# Patient Record
Sex: Male | Born: 1965 | Race: White | Hispanic: No | Marital: Married | Smoking: Never smoker
Health system: Southern US, Community
[De-identification: ages and names within clinical notes are randomized; demographics above are authoritative.]

## PROBLEM LIST (undated history)

## (undated) DIAGNOSIS — M1990 Unspecified osteoarthritis, unspecified site: Secondary | ICD-10-CM

## (undated) DIAGNOSIS — E785 Hyperlipidemia, unspecified: Secondary | ICD-10-CM

## (undated) DIAGNOSIS — Z87442 Personal history of urinary calculi: Secondary | ICD-10-CM

## (undated) DIAGNOSIS — E119 Type 2 diabetes mellitus without complications: Secondary | ICD-10-CM

## (undated) DIAGNOSIS — N200 Calculus of kidney: Secondary | ICD-10-CM

## (undated) DIAGNOSIS — G4730 Sleep apnea, unspecified: Secondary | ICD-10-CM

## (undated) DIAGNOSIS — J449 Chronic obstructive pulmonary disease, unspecified: Secondary | ICD-10-CM

## (undated) DIAGNOSIS — I10 Essential (primary) hypertension: Secondary | ICD-10-CM

## (undated) HISTORY — PX: APPENDECTOMY: SHX54

## (undated) HISTORY — PX: CHOLECYSTECTOMY: SHX55

---

## 2009-07-13 ENCOUNTER — Emergency Department (HOSPITAL_COMMUNITY): Admission: EM | Admit: 2009-07-13 | Discharge: 2009-07-13 | Payer: Self-pay | Admitting: Emergency Medicine

## 2010-07-12 ENCOUNTER — Emergency Department (HOSPITAL_COMMUNITY): Admission: EM | Admit: 2010-07-12 | Discharge: 2010-07-12 | Payer: Self-pay | Admitting: Emergency Medicine

## 2010-12-02 LAB — D-DIMER, QUANTITATIVE: D-Dimer, Quant: 0.27 ug/mL-FEU (ref 0.00–0.48)

## 2017-11-08 ENCOUNTER — Encounter: Payer: Self-pay | Admitting: Gastroenterology

## 2017-11-29 ENCOUNTER — Ambulatory Visit: Payer: Self-pay

## 2019-04-25 ENCOUNTER — Encounter: Payer: Self-pay | Admitting: *Deleted

## 2019-05-23 ENCOUNTER — Other Ambulatory Visit: Payer: Self-pay

## 2019-05-23 ENCOUNTER — Ambulatory Visit (INDEPENDENT_AMBULATORY_CARE_PROVIDER_SITE_OTHER): Payer: BC Managed Care – PPO | Admitting: *Deleted

## 2019-05-23 DIAGNOSIS — Z1211 Encounter for screening for malignant neoplasm of colon: Secondary | ICD-10-CM

## 2019-05-23 NOTE — Progress Notes (Signed)
Pt did not want to set up a procedure date today.  He will call me back on Wednesday when he has his work calendar available.

## 2019-05-23 NOTE — Progress Notes (Signed)
Ok to schedule with Dr. Oneida Alar with 12.5mg  pre-procedure Phenergan  When he is able to schedule will need to hold his Januvia the morning of (or the night before, depending on when he normally takes it). Metformin, hold the morning of.  On prep day: Check CBG ac and hs as well (if they normally check their blood sugar) as if the patient feels like their blood sugar is off. Can use soda, juice (that's in [place]) as needed for any low blood sugar.  Check CBG on arrival to endo unit.

## 2019-05-23 NOTE — Progress Notes (Addendum)
Gastroenterology Pre-Procedure Review  Request Date: 05/23/2019 Requesting Physician: Althea Grimmer, NP @ [facility], Last TCS 20 to 25 years ago, pt unsure of physician who did it, no polyps per pt  PATIENT REVIEW QUESTIONS: The patient responded to the following health history questions as indicated:    1. Diabetes Melitis: yes 2. Joint replacements in the past 12 months: no 3. Major health problems in the past 3 months: no 4. Has an artificial valve or MVP: no 5. Has a defibrillator: no 6. Has been advised in past to take antibiotics in advance of a procedure like teeth cleaning: no 7. Family history of colon cancer: no  8. Alcohol Use: yes, 2 to 3 beers a week and 1 shot of liquor  9. Illicit drug Use: no 10. History of sleep apnea: no  11. History of coronary artery or other vascular stents placed within the last 12 months: no 12. History of any prior anesthesia complications: no 13. There is no height or weight on file to calculate BMI. ht: 5 '10 wt: 265 lbs    MEDICATIONS & ALLERGIES:    Patient reports the following regarding taking any blood thinners:   Plavix? no Aspirin? no Coumadin? no Brilinta? no Xarelto? no Eliquis? no Pradaxa? no Savaysa? no Effient? no  Patient confirms/reports the following medications:  Current Outpatient Medications  Medication Sig Dispense Refill  . lisinopril (ZESTRIL) 20 MG tablet Take 20 mg by mouth daily.    . meloxicam (MOBIC) 15 MG tablet Take 15 mg by mouth daily.    . metFORMIN (GLUCOPHAGE) 1000 MG tablet Take 1,000 mg by mouth daily.    . sitaGLIPtin (JANUVIA) 100 MG tablet Take 100 mg by mouth daily.     No current facility-administered medications for this visit.     Patient confirms/reports the following allergies:  No Known Allergies  No orders of the defined types were placed in this encounter.   AUTHORIZATION INFORMATION Primary Insurance: [place],  [place] #: [id number],  Group #:  [id number] Pre-Cert / Josem Kaufmann required: No, not required per Doreene Adas Pre-Cert / Auth #: Ref# [id number]  SCHEDULE INFORMATION: Procedure has been scheduled as follows:  Date: 09/10/2019, Time: 8:30 Location: [facility] with Dr. Oneida Alar  This Gastroenterology Pre-Precedure Review Form is being routed to the following provider(s): Walden Field, NP

## 2019-05-30 ENCOUNTER — Telehealth: Payer: Self-pay | Admitting: *Deleted

## 2019-05-30 NOTE — Telephone Encounter (Signed)
Tried to call pt to follow up and get him scheduled for a procedure.

## 2019-05-31 NOTE — Telephone Encounter (Signed)
Lmom to follow up on pt.  Need to schedule him for procedure.

## 2019-06-06 ENCOUNTER — Encounter: Payer: Self-pay | Admitting: *Deleted

## 2019-06-06 MED ORDER — PEG 3350-KCL-NA BICARB-NACL 420 G PO SOLR: 4000.0000 mL | Freq: Once | ORAL | 0 refills | Status: AC

## 2019-06-06 NOTE — Progress Notes (Signed)
Mailed letter to pt with diabetes medication adjustments.   

## 2019-06-06 NOTE — Addendum Note (Signed)
Addended by: Metro Kung on: 06/06/2019 01:57 PM   Modules accepted: Orders

## 2019-06-06 NOTE — Progress Notes (Signed)
Called pt and he scheduled his procedure for 09/10/2019.

## 2019-06-06 NOTE — Patient Instructions (Addendum)
Andrew Roach   [birth year] MRN: [id number]    Procedure Date: 09/10/2019 Time to register: 7:30 am Place to register: [facility] Procedure Time: 8:30 am Scheduled provider: Dr. Oneida Alar  PREPARATION FOR COLONOSCOPY WITH TRI-LYTE SPLIT PREP  Please notify us immediately if you are diabetic, take iron supplements, or if you are on Coumadin or any other blood thinners.   Please hold the following medications: See letter  You will need to purchase 1 fleet enema and 1 box of Bisacodyl 71m tablets.   1 DAY BEFORE PROCEDURE:  DATE: 09/09/2019  DAY: Sunday Continue clear liquids the entire day - NO SOLID FOOD.   Diabetic medications adjustments for today: See letter  At 2:00 pm:  Take 2 Bisacodyl tablets.   At 4:00pm:  Start drinking your solution. Make sure you mix well per instructions on the bottle. Try to drink 1 (one) 8 ounce glass every 10-15 minutes until you have consumed HALF the jug. You should complete by 6:00pm.You must keep the left over solution refrigerated until completed next day.  Continue clear liquids. You must drink plenty of clear liquids to prevent dehyration and kidney failure.     DAY OF PROCEDURE:   DATE: 09/10/2019   DAY: Monday If you take medications for your heart, blood pressure or breathing, you may take these medications.  Diabetic medications adjustments for today: See letter  Five hours before your procedure time @ 3:30 am:  Finish remaining amout of bowel prep, drinking 1 (one) 8 ounce glass every 10-15 minutes until complete. You have two hours to consume remaining prep.   Three hours before your procedure time @ 5:30 am:  Nothing by mouth.   At least one hour before going to the hospital:  Give yourself one Fleet enema. You may take your morning medications with sip of water unless we have instructed otherwise.      Please see below for Dietary Information.  CLEAR LIQUIDS INCLUDE:  Water Jello (NOT red in color)   Ice Popsicles  (NOT red in color)   Tea (sugar ok, no milk/cream) Powdered fruit flavored drinks  Coffee (sugar ok, no milk/cream) Gatorade/ Lemonade/ Kool-Aid  (NOT red in color)   Juice: apple, white grape, white cranberry Soft drinks  Clear bullion, consomme, broth (fat free beef/chicken/vegetable)  Carbonated beverages (any kind)  Strained chicken noodle soup Hard Candy   Remember: Clear liquids are liquids that will allow you to see your fingers on the other side of a clear glass. Be sure liquids are NOT red in color, and not cloudy, but CLEAR.  DO NOT EAT OR DRINK ANY OF THE FOLLOWING:  Dairy products of any kind   Cranberry juice Tomato juice / V8 juice   Grapefruit juice Orange juice     Red grape juice  Do not eat any solid foods, including such foods as: cereal, oatmeal, yogurt, fruits, vegetables, creamed soups, eggs, bread, crackers, pureed foods in a blender, etc.   HELPFUL HINTS FOR DRINKING PREP SOLUTION:   Make sure prep is extremely cold. Mix and refrigerate the the morning of the prep. You may also put in the freezer.   You may try mixing some Crystal Light or Country Time Lemonade if you prefer. Mix in small amounts; add more if necessary.  Try drinking through a straw  Rinse mouth with water or a mouthwash between glasses, to remove after-taste.  Try sipping on a cold beverage /ice/ popsicles between glasses of prep.  Place a  piece of sugar-free hard candy in mouth between glasses.  If you become nauseated, try consuming smaller amounts, or stretch out the time between glasses. Stop for 30-60 minutes, then slowly start back drinking.        OTHER INSTRUCTIONS  You will need a responsible adult at least 55 years of age to accompany you and drive you home. This person must remain in the waiting room during your procedure. The hospital will cancel your procedure if you do not have a responsible adult with you.   1. Wear loose fitting clothing that is easily  removed. 2. Leave jewelry and other valuables at home.  3. Remove all body piercing jewelry and leave at home. 4. Total time from sign-in until discharge is approximately 2-3 hours. 5. You should go home directly after your procedure and rest. You can resume normal activities the day after your procedure. 6. The day of your procedure you should not:  Drive  Make legal decisions  Operate machinery  Drink alcohol  Return to work   You may call the office (Dept: [phone]) before 5:00pm, or page the doctor on call [phone]) after 5:00pm, for further instructions, if necessary.   Insurance Information YOU WILL NEED TO CHECK WITH YOUR INSURANCE COMPANY FOR THE BENEFITS OF COVERAGE YOU HAVE FOR THIS PROCEDURE.  UNFORTUNATELY, NOT ALL INSURANCE COMPANIES HAVE BENEFITS TO COVER ALL OR PART OF THESE TYPES OF PROCEDURES.  IT IS YOUR RESPONSIBILITY TO CHECK YOUR BENEFITS, HOWEVER, WE WILL BE GLAD TO ASSIST YOU WITH ANY CODES YOUR INSURANCE COMPANY MAY NEED.    PLEASE NOTE THAT MOST INSURANCE COMPANIES WILL NOT COVER A SCREENING COLONOSCOPY FOR PEOPLE UNDER THE AGE OF 50  IF YOU HAVE BCBS INSURANCE, YOU MAY HAVE BENEFITS FOR A SCREENING COLONOSCOPY BUT IF POLYPS ARE FOUND THE DIAGNOSIS WILL CHANGE AND THEN YOU MAY HAVE A DEDUCTIBLE THAT WILL NEED TO BE MET. SO PLEASE MAKE SURE YOU CHECK YOUR BENEFITS FOR A SCREENING COLONOSCOPY AS WELL AS A DIAGNOSTIC COLONOSCOPY.

## 2019-06-06 NOTE — Addendum Note (Signed)
Addended by: Metro Kung on: 06/06/2019 04:53 PM   Modules accepted: Orders, SmartSet

## 2019-09-04 ENCOUNTER — Telehealth: Payer: Self-pay | Admitting: Gastroenterology

## 2019-09-04 NOTE — Telephone Encounter (Signed)
Pt needs to cancel his procedure with [facility] on 09/10/2019. He will call later to reschedule

## 2019-09-04 NOTE — Telephone Encounter (Signed)
Called pt back.  Wife answered and informed me that they needed to cx the procedure for her husband.  She said that their daughter injured her ACL.  She said that they wanted to just cancel the procedure for now.  She said that he would call us back when he is ready to reschedule. Called Endo and informed Eber Jones.

## 2019-09-07 ENCOUNTER — Other Ambulatory Visit (HOSPITAL_COMMUNITY): Payer: BC Managed Care – PPO

## 2019-09-10 ENCOUNTER — Encounter (HOSPITAL_COMMUNITY): Payer: Self-pay

## 2019-09-10 ENCOUNTER — Ambulatory Visit (HOSPITAL_COMMUNITY): Admit: 2019-09-10 | Payer: BC Managed Care – PPO | Admitting: Gastroenterology

## 2019-09-10 SURGERY — COLONOSCOPY
Anesthesia: Moderate Sedation

## 2020-12-10 DIAGNOSIS — M2550 Pain in unspecified joint: Secondary | ICD-10-CM | POA: Insufficient documentation

## 2021-07-02 ENCOUNTER — Emergency Department (HOSPITAL_COMMUNITY)
Admission: EM | Admit: 2021-07-02 | Discharge: 2021-07-02 | Disposition: A | Discharge status: Home / Self Care | Payer: BC Managed Care – PPO | Attending: Emergency Medicine | Admitting: Emergency Medicine

## 2021-07-02 ENCOUNTER — Encounter (HOSPITAL_COMMUNITY): Payer: Self-pay | Admitting: *Deleted

## 2021-07-02 ENCOUNTER — Emergency Department (HOSPITAL_COMMUNITY): Payer: BC Managed Care – PPO

## 2021-07-02 DIAGNOSIS — D72829 Elevated white blood cell count, unspecified: Secondary | ICD-10-CM | POA: Insufficient documentation

## 2021-07-02 DIAGNOSIS — N201 Calculus of ureter: Secondary | ICD-10-CM | POA: Diagnosis not present

## 2021-07-02 DIAGNOSIS — R1031 Right lower quadrant pain: Secondary | ICD-10-CM | POA: Diagnosis present

## 2021-07-02 HISTORY — DX: Calculus of kidney: N20.0

## 2021-07-02 LAB — BASIC METABOLIC PANEL
Anion gap: 6 (ref 5–15)
BUN: 26 mg/dL — ABNORMAL HIGH (ref 6–20)
CO2: 28 mmol/L (ref 22–32)
Calcium: 8.9 mg/dL (ref 8.9–10.3)
Chloride: 107 mmol/L (ref 98–111)
Creatinine, Ser: 1.23 mg/dL (ref 0.61–1.24)
GFR, Estimated: >60 mL/min (ref >60)
Glucose, Bld: 160 mg/dL — ABNORMAL HIGH (ref 70–99)
Potassium: 4.2 mmol/L (ref 3.5–5.1)
Sodium: 141 mmol/L (ref 135–145)

## 2021-07-02 LAB — CBC WITH DIFFERENTIAL/PLATELET
Abs Immature Granulocytes: 0.07 10*3/uL (ref 0.00–0.07)
Basophils Absolute: 0.1 10*3/uL (ref 0.0–0.1)
Basophils Relative: 0 %
Eosinophils Absolute: 0.1 10*3/uL (ref 0.0–0.5)
Eosinophils Relative: 1 %
HCT: 44.5 % (ref 39.0–52.0)
Hemoglobin: 15.1 g/dL (ref 13.0–17.0)
Immature Granulocytes: 1 %
Lymphocytes Relative: 11 %
Lymphs Abs: 1.4 10*3/uL (ref 0.7–4.0)
MCH: 32.5 pg (ref 26.0–34.0)
MCHC: 33.9 g/dL (ref 30.0–36.0)
MCV: 95.7 fL (ref 80.0–100.0)
Monocytes Absolute: 0.8 10*3/uL (ref 0.1–1.0)
Monocytes Relative: 7 %
Neutro Abs: 9.6 10*3/uL — ABNORMAL HIGH (ref 1.7–7.7)
Neutrophils Relative %: 80 %
Platelets: 239 10*3/uL (ref 150–400)
RBC: 4.65 MIL/uL (ref 4.22–5.81)
RDW: 11.9 % (ref 11.5–15.5)
WBC: 11.9 10*3/uL — ABNORMAL HIGH (ref 4.0–10.5)
nRBC: 0 % (ref 0.0–0.2)

## 2021-07-02 LAB — URINALYSIS, ROUTINE W REFLEX MICROSCOPIC
Bacteria, UA: NONE SEEN
Bilirubin Urine: NEGATIVE
Glucose, UA: NEGATIVE mg/dL
Ketones, ur: 5 mg/dL — AB
Leukocytes,Ua: NEGATIVE
Nitrite: NEGATIVE
Protein, ur: NEGATIVE mg/dL
RBC / HPF: >50 RBC/hpf — ABNORMAL HIGH (ref 0–5)
Specific Gravity, Urine: 1.017 (ref 1.005–1.030)
pH: 7 (ref 5.0–8.0)

## 2021-07-02 MED ORDER — OXYCODONE-ACETAMINOPHEN 5-325 MG PO TABS: 1.0000 | ORAL_TABLET | Freq: Three times a day (TID) | ORAL | 0 refills | Status: AC | PRN

## 2021-07-02 MED ORDER — SODIUM CHLORIDE 0.9 % IV BOLUS
1000.0000 mL | Freq: Once | INTRAVENOUS | Status: AC
  Administered 2021-07-02: 1000 mL via INTRAVENOUS

## 2021-07-02 MED ORDER — TAMSULOSIN HCL 0.4 MG PO CAPS: 0.4000 mg | ORAL_CAPSULE | Freq: Every day | ORAL | 0 refills | Status: AC

## 2021-07-02 MED ORDER — ONDANSETRON HCL 4 MG/2ML IJ SOLN
4.0000 mg | Freq: Once | INTRAMUSCULAR | Status: AC
  Administered 2021-07-02: 4 mg via INTRAVENOUS
  Filled 2021-07-02: qty 2

## 2021-07-02 MED ORDER — KETOROLAC TROMETHAMINE 15 MG/ML IJ SOLN
15.0000 mg | Freq: Once | INTRAMUSCULAR | Status: AC
  Administered 2021-07-02: 15 mg via INTRAVENOUS
  Filled 2021-07-02: qty 1

## 2021-07-02 MED ORDER — ONDANSETRON HCL 4 MG PO TABS: 4.0000 mg | ORAL_TABLET | Freq: Three times a day (TID) | ORAL | 0 refills | Status: DC | PRN

## 2021-07-02 MED ORDER — HYDROMORPHONE HCL 1 MG/ML IJ SOLN
1.0000 mg | Freq: Once | INTRAMUSCULAR | Status: AC
  Administered 2021-07-02: 1 mg via INTRAVENOUS
  Filled 2021-07-02: qty 1

## 2021-07-02 MED ORDER — HYDROMORPHONE HCL 1 MG/ML IJ SOLN
0.5000 mg | Freq: Once | INTRAMUSCULAR | Status: AC
  Administered 2021-07-02: 0.5 mg via INTRAVENOUS
  Filled 2021-07-02: qty 1

## 2021-07-02 NOTE — Discharge Instructions (Signed)
You have a 2 mm stone in your right ureter about to pass in your bladder.  I have started you on Flomax this will help dilate your ureter please wear this medication can make you drop your blood pressure become lightheaded dizziness with this medication please discontinue.  Have also given you Zofran for nausea please take as prescribed. I have given you a short course of narcotics please take as prescribed.  This medication can make you drowsy do not consume alcohol or operate heavy machinery when taking this medication.  This medication is Tylenol in it do not take Tylenol and take this medication.   Please follow-up with urology for further evaluation.  Come back to the emergency department if you develop chest pain, shortness of breath, severe abdominal pain, uncontrolled nausea, vomiting, diarrhea.

## 2021-07-02 NOTE — ED Triage Notes (Signed)
Right flank pain onset an hour ago

## 2021-07-02 NOTE — ED Provider Notes (Signed)
[facility] Provider Note   CSN: [id number] Arrival date & time: 07/02/21  1600     History Chief Complaint  Patient presents with   Flank Pain    Andrew Roach is a 55 y.o. male.  HPI  Patient with no significant medical history presents to the emergency department with chief complaint of severe right lower quadrant pain and flank pain.  Patient stated came on suddenly, started around 3 PM today, describes it as a sharp stabbing-like sensation, pain is worsened with urination, he notes that he is having difficulty urinating, he had nausea and vomiting, but denies constipation diarrhea, denies hematemesis or coffee-ground emesis, denies fevers, chills, chest pain, shortness of breath, general body aches, he has no other complaints this time.  He has no significant abdominal history, states he has had kidney stones in the past and this feels very similar to this.  Denies alleviating or aggravating factors.  Past Medical History:  Diagnosis Date   Kidney stones     There are no problems to display for this patient.        No family history on file.  Social History   Tobacco Use   Smoking status: Never   Smokeless tobacco: Never  Substance Use Topics   Alcohol use: Never   Drug use: Never    Home Medications Prior to Admission medications   Medication Sig Start Date End Date Taking? Authorizing Provider  ondansetron (ZOFRAN) 4 MG tablet Take 1 tablet (4 mg total) by mouth every 8 (eight) hours as needed for nausea or vomiting. 07/02/21  Yes Marcello Fennel, PA-C  oxyCODONE-acetaminophen (PERCOCET/ROXICET) 5-325 MG tablet Take 1 tablet by mouth every 8 (eight) hours as needed for up to 2 days for severe pain. 07/02/21 07/04/21 Yes Marcello Fennel, PA-C  oxyCODONE-acetaminophen (PERCOCET/ROXICET) 5-325 MG tablet Take 1 tablet by mouth every 8 (eight) hours as needed for up to 2 days for severe pain. 07/02/21 07/04/21 Yes Marcello Fennel,  PA-C  tamsulosin (FLOMAX) 0.4 MG CAPS capsule Take 1 capsule (0.4 mg total) by mouth daily for 7 days. 07/02/21 07/09/21 Yes Marcello Fennel, PA-C  lisinopril (ZESTRIL) 20 MG tablet Take 20 mg by mouth daily.    [provider]  meloxicam (MOBIC) 15 MG tablet Take 15 mg by mouth daily.    [provider]  metFORMIN (GLUCOPHAGE) 1000 MG tablet Take 1,000 mg by mouth daily.    [provider]  sitaGLIPtin (JANUVIA) 100 MG tablet Take 100 mg by mouth daily.    [provider]    Allergies    Patient has no known allergies.  Review of Systems   Review of Systems  Constitutional:  Negative for chills and fever.  HENT:  Negative for congestion.   Respiratory:  Negative for shortness of breath.   Cardiovascular:  Negative for chest pain.  Gastrointestinal:  Positive for abdominal pain, nausea and vomiting. Negative for diarrhea.  Genitourinary:  Positive for dysuria and flank pain. Negative for enuresis.  Musculoskeletal:  Negative for back pain.  Skin:  Negative for rash.  Neurological:  Negative for dizziness.  Hematological:  Does not bruise/bleed easily.   Physical Exam Updated Vital Signs BP 106/66 (BP Location: Left Arm)   Pulse 62   Temp (!) 97.5 F (36.4 C) (Oral)   Resp 17   SpO2 98%   Physical Exam Vitals and nursing note reviewed.  Constitutional:      General: He is in  acute distress.     Appearance: He is not ill-appearing.     Comments: On exam patient was in acute distress, standing walk around pacing due to pain.  HENT:     Head: Normocephalic and atraumatic.     Nose: No congestion.  Eyes:     Conjunctiva/sclera: Conjunctivae normal.  Cardiovascular:     Rate and Rhythm: Normal rate and regular rhythm.  Pulmonary:     Effort: Pulmonary effort is normal.  Abdominal:     General: There is no distension.     Palpations: Abdomen is soft.     Tenderness: There is abdominal tenderness. There is right CVA tenderness. There  is no left CVA tenderness.     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  Skin:    General: Skin is warm and dry.  Neurological:     Mental Status: He is alert.  Psychiatric:        Mood and Affect: Mood normal.    ED Results / Procedures / Treatments   Labs (all labs ordered are listed, but only abnormal results are displayed) Labs Reviewed  BASIC METABOLIC PANEL - Abnormal; Notable for the following components:      Result Value   Glucose, Bld 160 (*)    BUN 26 (*)    All other components within normal limits  CBC WITH DIFFERENTIAL/PLATELET - Abnormal; Notable for the following components:   WBC 11.9 (*)    Neutro Abs 9.6 (*)    All other components within normal limits  URINALYSIS, ROUTINE W REFLEX MICROSCOPIC - Abnormal; Notable for the following components:   Hgb urine dipstick MODERATE (*)    Ketones, ur 5 (*)    RBC / HPF >50 (*)    All other components within normal limits    EKG None  Radiology CT Renal Stone Study  Result Date: 07/02/2021 CLINICAL DATA:  Right flank pain EXAM: CT ABDOMEN AND PELVIS WITHOUT CONTRAST TECHNIQUE: Multidetector CT imaging of the abdomen and pelvis was performed following the standard protocol without IV contrast. COMPARISON:  None. FINDINGS: Lower chest: No acute abnormality. Evaluation of the abdominal viscera limited by lack of IV contrast. Hepatobiliary: Hepatic steatosis. No focal liver lesion identified. There is a small gallstone. No gallbladder wall thickening or pericholecystic fluid. Pancreas: Unremarkable. No surrounding inflammatory changes. Spleen: Normal in size without focal abnormality. Adrenals/Urinary Tract: Adrenal glands are unremarkable. There is mild right hydronephrosis secondary to a 75mm calculus in the distal right ureter (series 2,  image 72). There is a punctate nonobstructing calculus in the left kidney. No hydronephrosis in the left kidney. No mass lesions in the kidneys. Unremarkable urinary bladder. Stomach/Bowel: Stomach is within normal limits. Appendix not well visualized but there are no inflammatory changes in the right lower quadrant to suggest appendicitis. No evidence of bowel wall thickening, distention, or inflammatory changes. Vascular/Lymphatic: Mild aortic atherosclerosis. No enlarged abdominal or pelvic lymph nodes. Reproductive: Enlarged prostate measuring 6.4 cm in diameter. Other: No abdominopelvic ascites. Musculoskeletal: Degenerate disc disease in the lumbar spine. There is hyperdensity in the L1-2 disc space possibly related to prior intervention. IMPRESSION: 1. Mild right hydronephrosis secondary to a 2 mm calculus in the distal right ureter. 2. Hepatic steatosis. 3. Cholelithiasis without evidence of acute cholecystitis. 4. Enlarged prostate measuring 6.4 cm in diameter. 5. Aortic atherosclerosis. Aortic Atherosclerosis (  ICD10-I70.0). Electronically Signed   By: Audie Pinto M.D.   On: 07/02/2021 18:11    Procedures Procedures   Medications Ordered in ED Medications  HYDROmorphone (DILAUDID) injection 0.5 mg (has no administration in time range)  ketorolac (TORADOL) 15 MG/ML injection 15 mg (has no administration in time range)  sodium chloride 0.9 % bolus 1,000 mL (0 mLs Intravenous Stopped 07/02/21 1812)  ondansetron (ZOFRAN) injection 4 mg (4 mg Intravenous Given 07/02/21 1628)  HYDROmorphone (DILAUDID) injection 1 mg (1 mg Intravenous Given 07/02/21 1628)    ED Course  I have reviewed the triage vital signs and the nursing notes.  Pertinent labs & imaging results that were available during my care of the patient were reviewed by me and considered in my medical decision making (see chart for details).    MDM Rules/Calculators/A&P                          Initial impression-presents with  right-sided flank pain he is alert, appears in acute distress, vital signs reassuring.  Suspect kidney stone, will obtain basic lab work-up, provide patient with pain medications, obtain UA and reassess.  Work-up-CBC shows slight leukocytosis of 11.9, BMP shows glucose of 160, BUN 26.CT renal reveals mild right versus secondary to a 2 mm calculus in the distal right ureter.  Cholelithiasis without acute evidence of cholecystitis.  Enlarged prostate measuring 6.4 cm in diameter.  UA negative for nitrates, leukocytes, hematuria.  Positive for red blood cells.  Reassessment-patient reassessed after fluids, Dilaudid, antiemetics he states he is feeling much better, he is resting calmly in bed, will continue to monitor.  We will send him down for CT renal for further eval.   Patient was updated on imaging, states he is feeling better, vital signs remained stable, will obtain UA to rule out septic stone.  Patient is reassessed has no complaints this time, vital signs remained stable, he is agreeable for discharge.  Rule out-I have low suspicion for systemic infection as patient is nontoxic-appearing, vital signs are reassuring.  Does have slight leukocytosis of 11.9 but likely this is more acute phase reaction is due to the 2 mm stone.  I have low suspicion for a septic stone as urine is not submitted for signs of infection.  I have low suspicion for [place] as CT imaging is negative for these findings.  Low suspicion for bowel obstruction as abdomen is nondistended normal bowel sounds, dull to percussion, passing gas and having normal bowel movements.  Low suspicion for dissection of the aorta as presentation to atypical etiology.  Plan-  2 millimeter distal stone-likely causing his pain,  will start patient on Flomax, pain medication, antiemetics, have him follow-up with urology for further evaluation.  Given strict return precautions.  Vital signs have remained stable, no indication for hospital admission.     Patient given at home care as well strict return precautions.  Patient verbalized that they understood agreed to said plan.  Final Clinical Impression(s) / ED Diagnoses Final diagnoses:  Ureterolithiasis    Rx / DC Orders ED Discharge Orders          Ordered    tamsulosin (FLOMAX) 0.4 MG CAPS capsule  Daily        07/02/21 1938    ondansetron (ZOFRAN) 4 MG tablet  Every 8 hours PRN        07/02/21 1938    oxyCODONE-acetaminophen (PERCOCET/ROXICET) 5-325 MG tablet  Every 8 hours PRN  07/02/21 1939    oxyCODONE-acetaminophen (PERCOCET/ROXICET) 5-325 MG tablet  Every 8 hours PRN        07/02/21 1939             Barnie Del 07/02/21 1942    Eber Hong, MD 07/04/21 1426

## 2021-07-03 MED FILL — Oxycodone w/ Acetaminophen Tab 5-325 MG: ORAL | Qty: 6 | Status: AC

## 2021-09-25 DIAGNOSIS — E119 Type 2 diabetes mellitus without complications: Secondary | ICD-10-CM | POA: Insufficient documentation

## 2021-09-25 DIAGNOSIS — E782 Mixed hyperlipidemia: Secondary | ICD-10-CM | POA: Insufficient documentation

## 2021-11-20 DIAGNOSIS — E119 Type 2 diabetes mellitus without complications: Secondary | ICD-10-CM | POA: Insufficient documentation

## 2023-01-27 ENCOUNTER — Emergency Department (HOSPITAL_COMMUNITY): Payer: BC Managed Care – PPO

## 2023-01-27 ENCOUNTER — Other Ambulatory Visit: Payer: Self-pay

## 2023-01-27 ENCOUNTER — Encounter (HOSPITAL_COMMUNITY): Payer: Self-pay | Admitting: Emergency Medicine

## 2023-01-27 ENCOUNTER — Emergency Department (HOSPITAL_COMMUNITY)
Admission: EM | Admit: 2023-01-27 | Discharge: 2023-01-27 | Disposition: A | Discharge status: Home / Self Care | Payer: BC Managed Care – PPO | Attending: Emergency Medicine | Admitting: Emergency Medicine

## 2023-01-27 DIAGNOSIS — R109 Unspecified abdominal pain: Secondary | ICD-10-CM | POA: Diagnosis present

## 2023-01-27 DIAGNOSIS — Z79899 Other long term (current) drug therapy: Secondary | ICD-10-CM | POA: Diagnosis not present

## 2023-01-27 DIAGNOSIS — Z7984 Long term (current) use of oral hypoglycemic drugs: Secondary | ICD-10-CM | POA: Diagnosis not present

## 2023-01-27 DIAGNOSIS — E119 Type 2 diabetes mellitus without complications: Secondary | ICD-10-CM | POA: Diagnosis not present

## 2023-01-27 DIAGNOSIS — I10 Essential (primary) hypertension: Secondary | ICD-10-CM | POA: Insufficient documentation

## 2023-01-27 LAB — URINALYSIS, ROUTINE W REFLEX MICROSCOPIC
Bilirubin Urine: NEGATIVE
Glucose, UA: NEGATIVE mg/dL
Ketones, ur: NEGATIVE mg/dL
Leukocytes,Ua: NEGATIVE
Nitrite: NEGATIVE
Protein, ur: 30 mg/dL — AB
Specific Gravity, Urine: 1.028 (ref 1.005–1.030)
pH: 5 (ref 5.0–8.0)

## 2023-01-27 LAB — COMPREHENSIVE METABOLIC PANEL
ALT: 52 U/L — ABNORMAL HIGH (ref 0–44)
AST: 34 U/L (ref 15–41)
Albumin: 3.8 g/dL (ref 3.5–5.0)
Alkaline Phosphatase: 75 U/L (ref 38–126)
Anion gap: 9 (ref 5–15)
BUN: 19 mg/dL (ref 6–20)
CO2: 23 mmol/L (ref 22–32)
Calcium: 8.8 mg/dL — ABNORMAL LOW (ref 8.9–10.3)
Chloride: 105 mmol/L (ref 98–111)
Creatinine, Ser: 1.22 mg/dL (ref 0.61–1.24)
GFR, Estimated: >60 mL/min (ref >60)
Glucose, Bld: 212 mg/dL — ABNORMAL HIGH (ref 70–99)
Potassium: 4.3 mmol/L (ref 3.5–5.1)
Sodium: 137 mmol/L (ref 135–145)
Total Bilirubin: 1 mg/dL (ref 0.3–1.2)
Total Protein: 6.9 g/dL (ref 6.5–8.1)

## 2023-01-27 LAB — CBC WITH DIFFERENTIAL/PLATELET
Abs Immature Granulocytes: 0.02 10*3/uL (ref 0.00–0.07)
Basophils Absolute: 0.1 10*3/uL (ref 0.0–0.1)
Basophils Relative: 1 %
Eosinophils Absolute: 0.1 10*3/uL (ref 0.0–0.5)
Eosinophils Relative: 1 %
HCT: 45 % (ref 39.0–52.0)
Hemoglobin: 15.7 g/dL (ref 13.0–17.0)
Immature Granulocytes: 0 %
Lymphocytes Relative: 35 %
Lymphs Abs: 3 10*3/uL (ref 0.7–4.0)
MCH: 32.5 pg (ref 26.0–34.0)
MCHC: 34.9 g/dL (ref 30.0–36.0)
MCV: 93.2 fL (ref 80.0–100.0)
Monocytes Absolute: 0.7 10*3/uL (ref 0.1–1.0)
Monocytes Relative: 8 %
Neutro Abs: 4.6 10*3/uL (ref 1.7–7.7)
Neutrophils Relative %: 55 %
Platelets: 239 10*3/uL (ref 150–400)
RBC: 4.83 MIL/uL (ref 4.22–5.81)
RDW: 12.3 % (ref 11.5–15.5)
WBC: 8.4 10*3/uL (ref 4.0–10.5)
nRBC: 0 % (ref 0.0–0.2)

## 2023-01-27 MED ORDER — TAMSULOSIN HCL 0.4 MG PO CAPS: 0.4000 mg | ORAL_CAPSULE | Freq: Every day | ORAL | 0 refills | Status: DC

## 2023-01-27 MED ORDER — SODIUM CHLORIDE 0.9 % IV BOLUS
1000.0000 mL | Freq: Once | INTRAVENOUS | Status: AC
  Administered 2023-01-27: 1000 mL via INTRAVENOUS

## 2023-01-27 MED ORDER — HYDROMORPHONE HCL 1 MG/ML IJ SOLN
0.5000 mg | Freq: Once | INTRAMUSCULAR | Status: AC
  Administered 2023-01-27: 0.5 mg via INTRAVENOUS
  Filled 2023-01-27: qty 0.5

## 2023-01-27 MED ORDER — KETOROLAC TROMETHAMINE 30 MG/ML IJ SOLN
30.0000 mg | Freq: Once | INTRAMUSCULAR | Status: AC
  Administered 2023-01-27: 30 mg via INTRAVENOUS
  Filled 2023-01-27: qty 1

## 2023-01-27 MED ORDER — SODIUM CHLORIDE 0.9 % IV BOLUS: 1000.0000 mL | Freq: Once | INTRAVENOUS | Status: DC

## 2023-01-27 MED ORDER — HYDROMORPHONE HCL 1 MG/ML IJ SOLN
0.5000 mg | INTRAMUSCULAR | Status: DC | PRN
  Administered 2023-01-27 (×3): 0.5 mg via INTRAVENOUS
  Filled 2023-01-27 (×3): qty 1

## 2023-01-27 MED ORDER — HYDROCODONE-ACETAMINOPHEN 5-325 MG PO TABS: 1.0000 | ORAL_TABLET | Freq: Four times a day (QID) | ORAL | 0 refills | Status: DC | PRN

## 2023-01-27 MED ORDER — ONDANSETRON HCL 4 MG PO TABS: 4.0000 mg | ORAL_TABLET | Freq: Four times a day (QID) | ORAL | 0 refills | Status: DC

## 2023-01-27 MED ORDER — ONDANSETRON HCL 4 MG/2ML IJ SOLN
4.0000 mg | Freq: Once | INTRAMUSCULAR | Status: AC
  Administered 2023-01-27: 4 mg via INTRAVENOUS
  Filled 2023-01-27: qty 2

## 2023-01-27 NOTE — ED Notes (Signed)
Patient ambulated to bathroom.

## 2023-01-27 NOTE — ED Triage Notes (Signed)
Pt c/o left flank / groin pain. Started suddenly with cold sweats & vomiting. EDP at bedside for MSE.

## 2023-01-27 NOTE — ED Provider Notes (Signed)
Patient signed out to me by previous provider. Please refer to their note for full HPI.  Briefly this is a 57 year old male with past medical history of kidney stones who presents emergency department with ongoing flank pain.  Transferred from outside facility due to lack of CT scan.  Given his history it was recommended that he gets transferred for CT scan.  Imaging is pending.  Vitals are stable.  CT scan shows punctate left distal ureter stone with mild hydronephrosis.  Kidney function is normal, urinalysis shows no UTI.  Currently patient's symptoms are controlled.  Will plan for symptomatic and outpatient treatment.  Patient at this time appears safe and stable for discharge and close outpatient follow up. Discharge plan and strict return to ED precautions discussed, patient verbalizes understanding and agreement.   Rozelle Logan, DO 01/27/23 2211

## 2023-01-27 NOTE — ED Notes (Signed)
Pt brought in from [facility] for CT scan due to left side flank pain. Pt does have history of kidney stones.  Pt given 2L NS, dilaudid x3, toradol and zofran at [facility].  VSS.

## 2023-01-27 NOTE — ED Notes (Signed)
Pt is unsuccessful at urine attempt. MD made aware

## 2023-01-27 NOTE — ED Provider Notes (Signed)
[place] EMERGENCY DEPARTMENT AT [facility] Provider Note   CSN: [id number] Arrival date & time: 01/27/23  4098     History  Chief Complaint  Patient presents with   Flank Pain    Andrew Roach is a 57 y.o. male.  Patient has a history of obesity, hypertension, diabetes and a history of a kidney stone.  Patient states that he was at work today and also started having severe left flank pain going into his left groin  The history is provided by the patient and medical records. No language interpreter was used.  Flank Pain This is a new problem. The current episode started less than 1 hour ago. The problem occurs constantly. The problem has not changed since onset.Associated symptoms include abdominal pain. Pertinent negatives include no chest pain and no headaches. Nothing aggravates the symptoms. Nothing relieves the symptoms. He has tried nothing for the symptoms.       Home Medications Prior to Admission medications   Medication Sig Start Date End Date Taking? Authorizing Provider  lisinopril (ZESTRIL) 20 MG tablet Take 20 mg by mouth daily.    [provider]  meloxicam (MOBIC) 15 MG tablet Take 15 mg by mouth daily.    [provider]  metFORMIN (GLUCOPHAGE) 1000 MG tablet Take 1,000 mg by mouth daily.    [provider]  ondansetron (ZOFRAN) 4 MG tablet Take 1 tablet (4 mg total) by mouth every 8 (eight) hours as needed for nausea or vomiting. 07/02/21   Carroll Sage, PA-C  sitaGLIPtin (JANUVIA) 100 MG tablet Take 100 mg by mouth daily.    [provider]      Allergies    Patient has no known allergies.    Review of Systems   Review of Systems  Constitutional:  Negative for appetite change and fatigue.  HENT:  Negative for congestion, ear discharge and sinus pressure.   Eyes:  Negative for discharge.  Respiratory:  Negative for cough.   Cardiovascular:  Negative for chest pain.  Gastrointestinal:  Positive for  abdominal pain. Negative for diarrhea.  Genitourinary:  Positive for flank pain. Negative for frequency and hematuria.  Musculoskeletal:  Negative for back pain.  Skin:  Negative for rash.  Neurological:  Negative for seizures and headaches.  Psychiatric/Behavioral:  Negative for hallucinations.     Physical Exam Updated Vital Signs BP 115/65   Pulse (!) 56   Temp 98.3 F (36.8 C) (Oral)   Resp 18   Ht 5\' 10"  (1.778 m)   Wt 122.5 kg   SpO2 96%   BMI 38.74 kg/m  Physical Exam Vitals and nursing note reviewed.  Constitutional:      Appearance: He is well-developed. He is obese. He is ill-appearing.  HENT:     Head: Normocephalic.     Nose: Nose normal.  Eyes:     General: No scleral icterus.    Conjunctiva/sclera: Conjunctivae normal.  Neck:     Thyroid: No thyromegaly.  Cardiovascular:     Rate and Rhythm: Normal rate and regular rhythm.     Heart sounds: No murmur heard.    No friction rub. No gallop.  Pulmonary:     Breath sounds: No stridor. No wheezing or rales.  Chest:     Chest wall: No tenderness.  Abdominal:     General: There is no distension.     Tenderness: There is abdominal tenderness. There is no rebound.     Comments: Tenderness to  left lower quadrant no rebound no masses  Genitourinary:    Comments: Tender left flank Musculoskeletal:        General: Normal range of motion.     Cervical back: Neck supple.  Lymphadenopathy:     Cervical: No cervical adenopathy.  Skin:    Findings: No erythema or rash.  Neurological:     Mental Status: He is oriented to person, place, and time.     Motor: No abnormal muscle tone.     Coordination: Coordination normal.  Psychiatric:        Behavior: Behavior normal.     ED Results / Procedures / Treatments   Labs (all labs ordered are listed, but only abnormal results are displayed) Labs Reviewed  COMPREHENSIVE METABOLIC PANEL - Abnormal; Notable for the following components:      Result Value   Glucose,  Bld 212 (*)    Calcium 8.8 (*)    ALT 52 (*)    All other components within normal limits  URINALYSIS, ROUTINE W REFLEX MICROSCOPIC - Abnormal; Notable for the following components:   Color, Urine AMBER (*)    APPearance TURBID (*)    Hgb urine dipstick MODERATE (*)    Protein, ur 30 (*)    Bacteria, UA RARE (*)    All other components within normal limits  CBC WITH DIFFERENTIAL/PLATELET    EKG None  Radiology DG Abdomen 1 View  Result Date: 01/27/2023 CLINICAL DATA:  Left flank pain EXAM: ABDOMEN - 1 VIEW COMPARISON:  None Available. FINDINGS: Bowel gas pattern is nonspecific. No abnormal masses are seen. There is a 9 mm calcific density in left side of pelvis. Possible faint calcific densities are seen in right upper quadrant of abdomen. Degenerative changes with bony spurs are seen in lumbar spine. IMPRESSION: There is a 9 mm calcific density in left side of pelvis which may suggest a vascular calcification or calculus in the distal left ureter. Faint calcifications are seen in right upper quadrant of abdomen suggesting possible gallbladder stones. Electronically Signed   By: Ernie Avena M.D.   On: 01/27/2023 10:53   US Abdomen Complete  Result Date: 01/27/2023 CLINICAL DATA:  Abdominal pain.  History of kidney stones. EXAM: ABDOMEN ULTRASOUND COMPLETE COMPARISON:  CT 07/02/2021 FINDINGS: Gallbladder: Gallbladder is distended with stones. No wall thickening or adjacent fluid. Common bile duct: Diameter: 8 mm, mildly enlarged. Liver: Diffusely echogenic hepatic parenchyma consistent with fatty liver infiltration. With this level of echogenicity evaluation for underlying mass lesion is limited and if needed follow-up contrast CT or MRI as clinically appropriate. Portal vein is patent on color Doppler imaging with normal direction of blood flow towards the liver. IVC: Obscured by overlapping bowel gas. Pancreas: Obscured by overlapping bowel gas. Spleen: 9.5 cm.  Preserved  echotexture. Right Kidney: Length: 12.7 cm. No collecting system dilatation. A few echogenic areas are seen. Stones are not excluded but these areas do not shadow. Left Kidney: Length: 13.7 cm.  Preserved echotexture. Abdominal aorta: Poorly seen with overlapping bowel gas. Upper aspect is measured at 3.2 cm would be mildly enlarged. Other findings: Study limited by overlapping bowel gas and soft tissue. IMPRESSION: Limited examination with overlapping bowel gas and soft tissue. Distended gallbladder with stones. No wall thickening or adjacent fluid. Common duct measures 8 mm which is mildly enlarged for the patient's age. Question tiny renal stones but these punctate foci do not shadow. No collecting system dilatation. Fatty liver infiltration. Aorta proximally measures 3.2 cm which would  be mildly enlarged. Overall with these findings would recommend additional workup with CT to further delineate when clinically able. Electronically Signed   By: Karen Kays M.D.   On: 01/27/2023 10:07    Procedures Procedures    Medications Ordered in ED Medications  sodium chloride 0.9 % bolus 1,000 mL (1,000 mLs Intravenous Not Given 01/27/23 1035)  HYDROmorphone (DILAUDID) injection 0.5 mg (0.5 mg Intravenous Given 01/27/23 0858)  ketorolac (TORADOL) 30 MG/ML injection 30 mg (30 mg Intravenous Given 01/27/23 0858)  ondansetron (ZOFRAN) injection 4 mg (4 mg Intravenous Given 01/27/23 0858)  HYDROmorphone (DILAUDID) injection 0.5 mg (0.5 mg Intravenous Given 01/27/23 0912)  sodium chloride 0.9 % bolus 1,000 mL (1,000 mLs Intravenous Bolus 01/27/23 1023)  HYDROmorphone (DILAUDID) injection 0.5 mg (0.5 mg Intravenous Given 01/27/23 1249)  sodium chloride 0.9 % bolus 1,000 mL (1,000 mLs Intravenous Bolus 01/27/23 1255)    ED Course/ Medical Decision Making/ A&P                             Medical Decision Making Amount and/or Complexity of Data Reviewed Labs: ordered. Radiology: ordered.  Risk Prescription drug  management.  This patient presents to the ED for concern of left flank pain, this involves an extensive number of treatment options, and is a complaint that carries with it a high risk of complications and morbidity.  The differential diagnosis includes the stone, UTI   Co morbidities that complicate the patient evaluation  Hypertension diabetes   Additional history obtained:  Additional history obtained from patient External records from outside source obtained and reviewed including hospital records   Lab Tests:  I Ordered, and personally interpreted labs.  The pertinent results include: Glucose 212   Imaging Studies ordered:  I ordered imaging studies including ultrasound abdomen I independently visualized and interpreted imaging which showed unremarkable except distended gallbladder I agree with the radiologist interpretation   Cardiac Monitoring: / EKG:  The patient was maintained on a cardiac monitor.  I personally viewed and interpreted the cardiac monitored which showed an underlying rhythm of: Normal sinus rhythm   Consultations Obtained:  I requested consultation with the urology,  and discussed lab and imaging findings as well as pertinent plan - they recommend: CT scan of abdomen   Problem List / ED Course / Critical interventions / Medication management  Hypertension diabetes and flank pain I ordered medication including Toradol and Dilaudid for pain Reevaluation of the patient after these medicines showed that the patient improved I have reviewed the patients home medicines and have made adjustments as needed   Social Determinants of Health:  None   Test / Admission - Considered:  CT of abdomen  Abdominal and flank pain.  Patient to get CT scan of the renal at [facility].  Patient does have gallstones on ultrasound and can follow-up with GI.  Patient has no right upper quadrant pain with normal white count and normal liver  studies        Final Clinical Impression(s) / ED Diagnoses Final diagnoses:  Flank pain    Rx / DC Orders ED Discharge Orders     None         Bethann Berkshire, MD 01/30/23 1224

## 2023-01-27 NOTE — Discharge Instructions (Addendum)
You have been seen and discharged from the emergency department.  CAT scan showed a left kidney stone that is almost all the way down to the bladder.  Stay well-hydrated.  You may take ibuprofen for pain control.  Take stronger pain medicine as needed.  Do not mix this medication with alcohol or other sedating medications. Do not drive or do heavy physical activity until you know how this medication affects you.  It may cause drowsiness.  Take Flomax to aid in passing.  Take nausea medicine as needed.  Follow-up with your primary provider for further evaluation and further care. Take home medications as prescribed. If you have any worsening symptoms, worsening flank pain, nausea/vomiting, fevers or further concerns for your health please return to an emergency department for further evaluation.

## 2023-01-27 NOTE — ED Notes (Signed)
Pt was bladder scanned, 6ml presented on the screen

## 2023-01-28 NOTE — ED Notes (Signed)
This RN wasted 0.5mg  of dilaudid x2 with Marcello Moores, [employer] fundraiser. Unable to waste in pyxis due to pt being discharged.

## 2023-04-13 IMAGING — CT CT RENAL STONE PROTOCOL
2 of 4 series · 16 of 46 positions shown, 18 images · non-contrast
Comparison: None.

CLINICAL DATA: Right flank pain

EXAM:
CT ABDOMEN AND PELVIS WITHOUT CONTRAST
TECHNIQUE: Multidetector CT imaging of the abdomen and pelvis was performed
following the standard protocol without IV contrast.

[Series 2: axial st · axial · 0.98mm/px · z∈[+244,+704]mm · 13 of 104 slices shown, 15 images]
[im 6/104  soft-tissue]
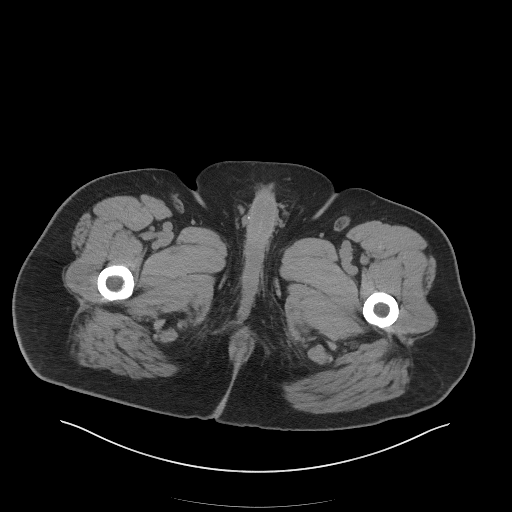
[im 6/104  bone]
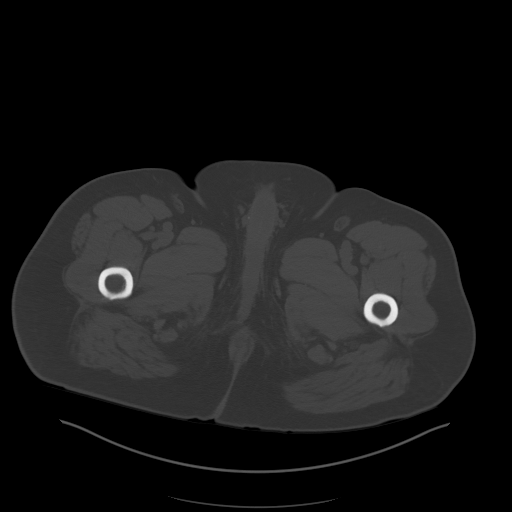
[im 17/104  soft-tissue]
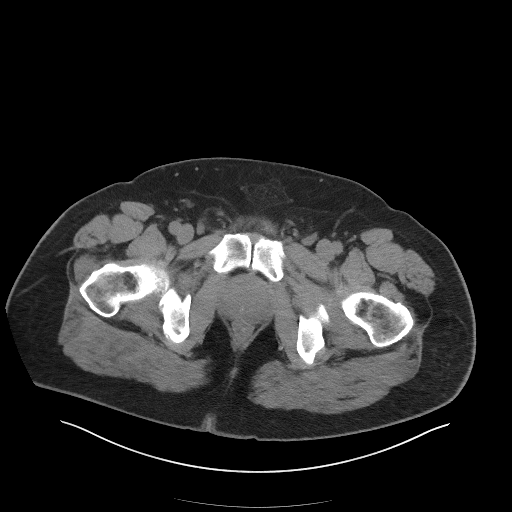
[im 22/104  soft-tissue]
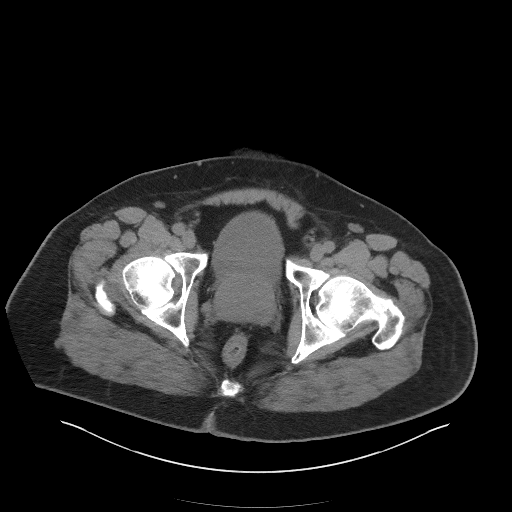
[im 28/104  soft-tissue]
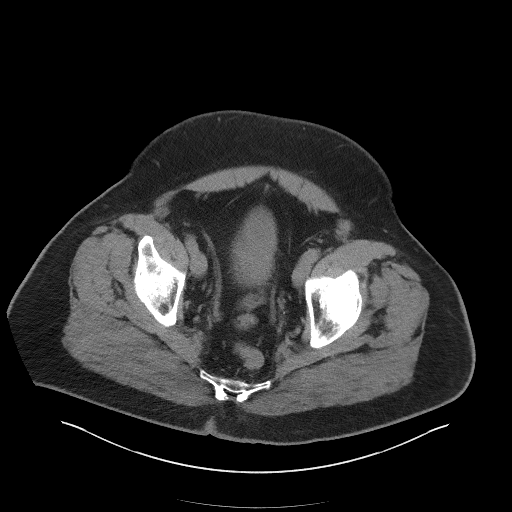
[im 38/104  soft-tissue]
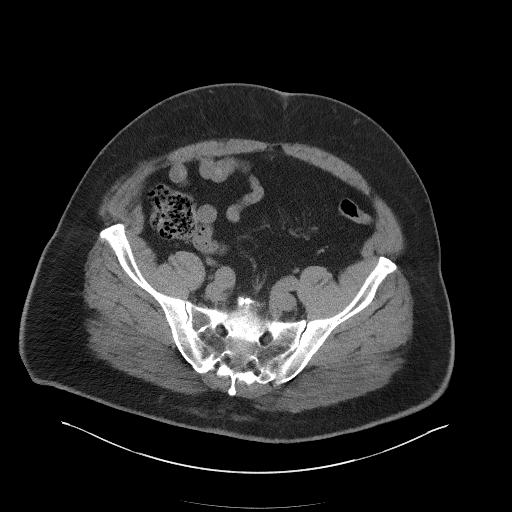
[im 44/104  soft-tissue]
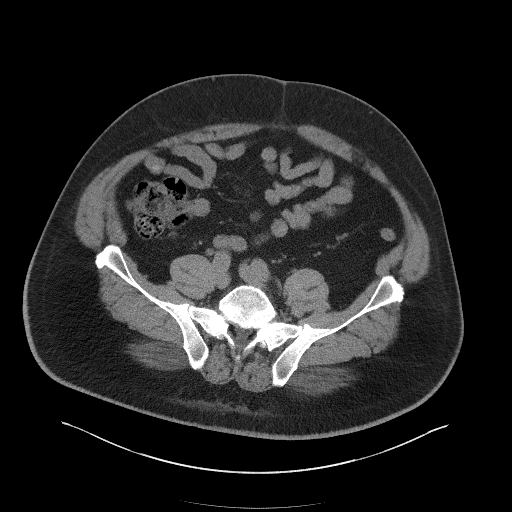
[im 55/104  soft-tissue]
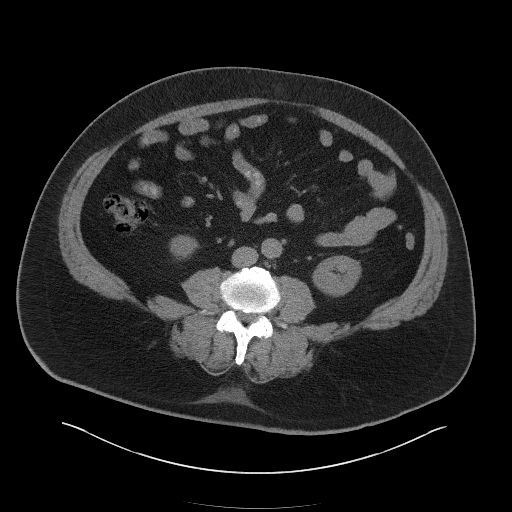
[im 60/104  soft-tissue]
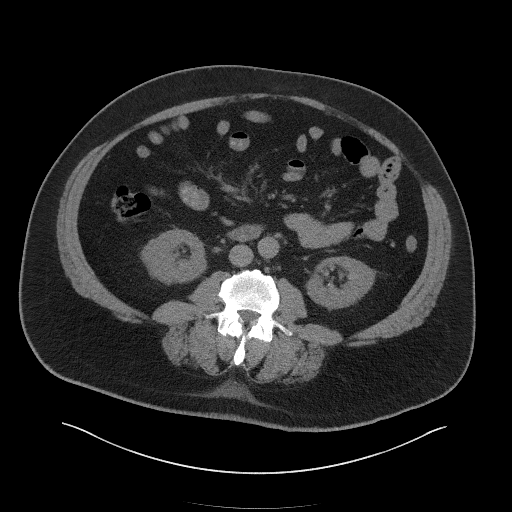
[im 66/104  soft-tissue]
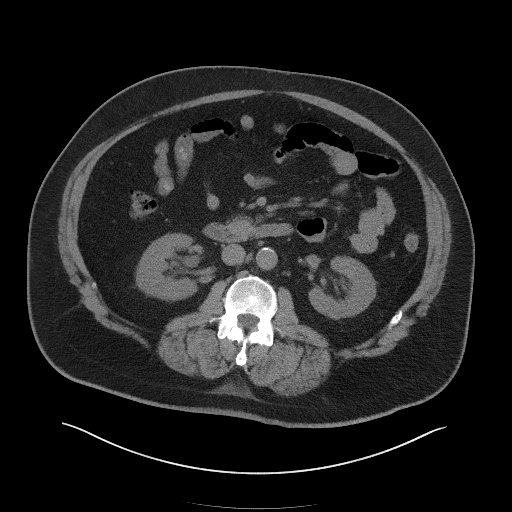
[im 66/104  bone]
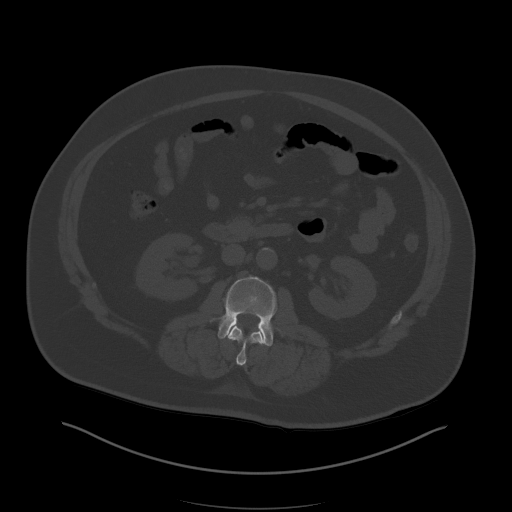
[im 76/104  soft-tissue]
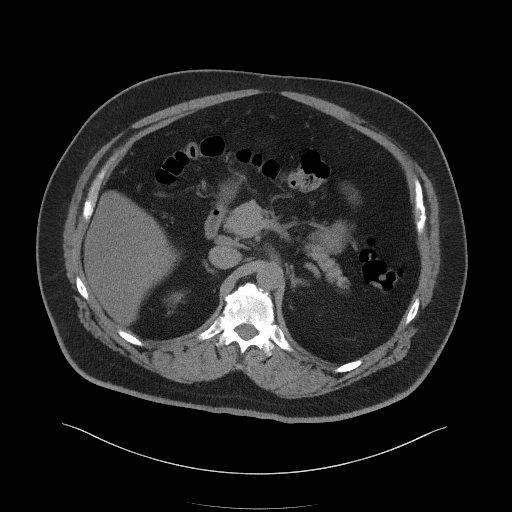
[im 82/104  soft-tissue]
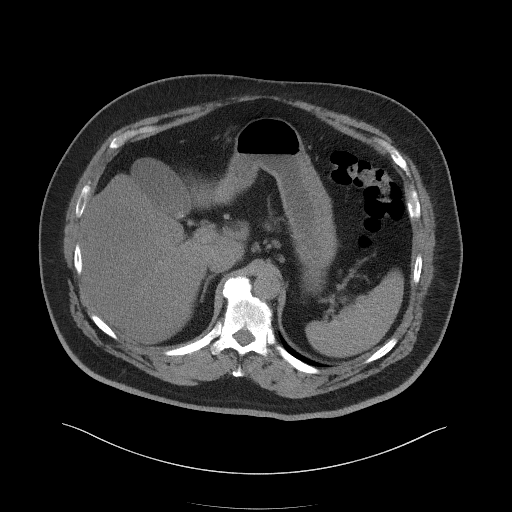
[im 87/104  soft-tissue]
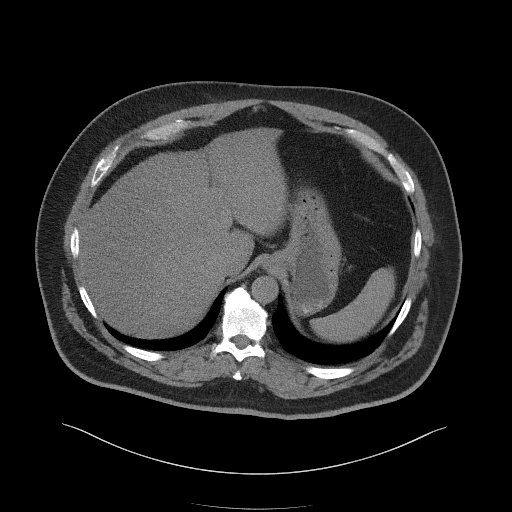
[im 98/104  soft-tissue]
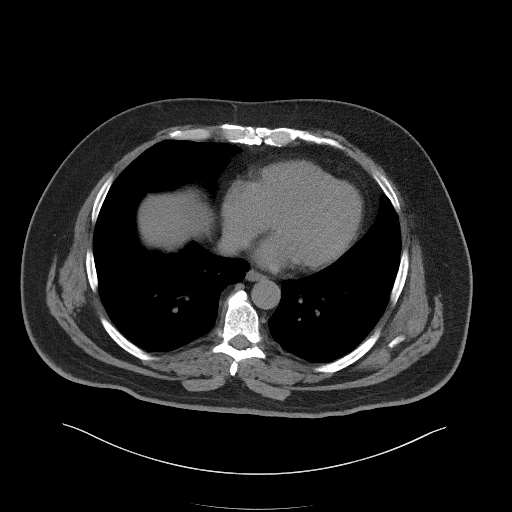

[Series 5: coronal st · coronal · 0.87mm/px · 3 of 128 slices shown]
[im 43/128  soft-tissue]
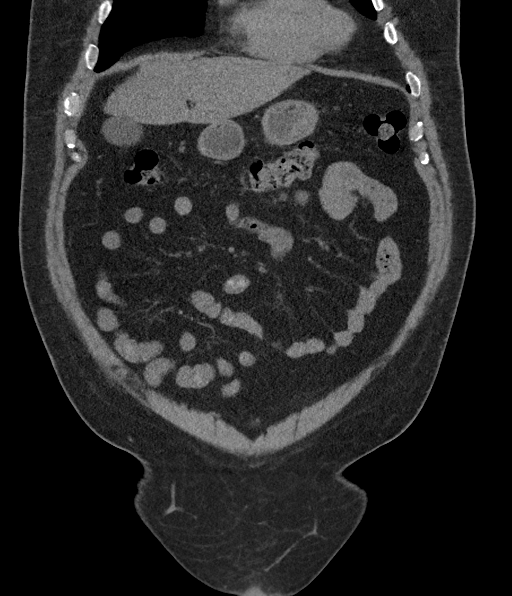
[im 57/128  soft-tissue]
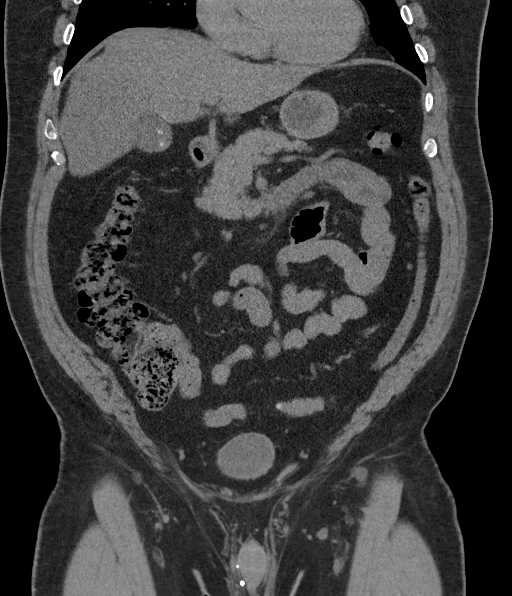
[im 71/128  soft-tissue]
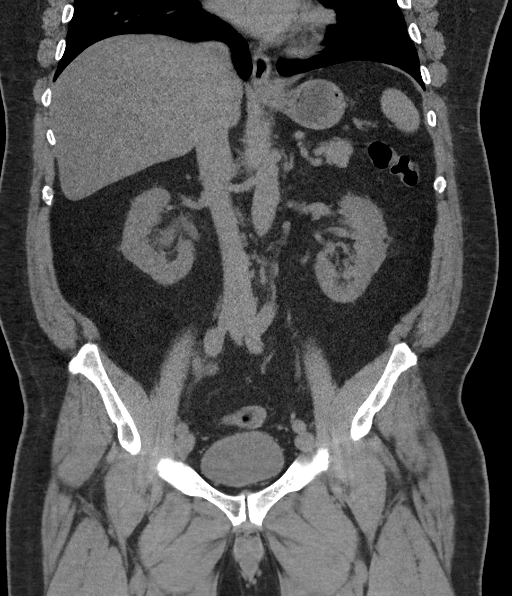

[16 of 46 positions shown; findings below may reference images not displayed]

FINDINGS: Lower chest: No acute abnormality.

Evaluation of the abdominal viscera limited by lack of IV contrast.

Hepatobiliary: Hepatic steatosis. No focal liver lesion identified.
There is a small gallstone. No gallbladder wall thickening or
pericholecystic fluid.

Pancreas: Unremarkable. No surrounding inflammatory changes.

Spleen: Normal in size without focal abnormality.

Adrenals/Urinary Tract: Adrenal glands are unremarkable. There is
mild right hydronephrosis secondary to a 2mm calculus in the distal
right ureter (series 2, image 72). There is a punctate
nonobstructing calculus in the left kidney. No hydronephrosis in the
left kidney. No mass lesions in the kidneys. Unremarkable urinary
bladder.

Stomach/Bowel: Stomach is within normal limits. Appendix not well
visualized but there are no inflammatory changes in the right lower
quadrant to suggest appendicitis. No evidence of bowel wall
thickening, distention, or inflammatory changes.

Vascular/Lymphatic: Mild aortic atherosclerosis. No enlarged
abdominal or pelvic lymph nodes.

Reproductive: Enlarged prostate measuring 6.4 cm in diameter.

Other: No abdominopelvic ascites.

Musculoskeletal: Degenerate disc disease in the lumbar spine. There
is hyperdensity in the L1-2 disc space possibly related to prior
intervention.
IMPRESSION: 1. Mild right hydronephrosis secondary to a 2 mm calculus in the
distal right ureter.
2. Hepatic steatosis.
3. Cholelithiasis without evidence of acute cholecystitis.
4. Enlarged prostate measuring 6.4 cm in diameter.
5. Aortic atherosclerosis.

Aortic Atherosclerosis ([id number]).

## 2023-06-07 ENCOUNTER — Encounter: Payer: Self-pay | Admitting: Podiatry

## 2023-06-07 NOTE — Anesthesia Preprocedure Evaluation (Addendum)
Anesthesia Evaluation  Patient identified by MRN, date of birth, ID band Patient awake    Reviewed: Allergy & Precautions, H&P , NPO status , Patient's Chart, lab work & pertinent test results  Airway Mallampati: III  TM Distance: >3 FB Neck ROM: Full   Comment: Short, thick neck Dental no notable dental hx.    Pulmonary neg pulmonary ROS   Pulmonary exam normal breath sounds clear to auscultation       Cardiovascular hypertension, Normal cardiovascular exam Rhythm:Regular Rate:Normal     Neuro/Psych negative neurological ROS  negative psych ROS   GI/Hepatic negative GI ROS, Neg liver ROS,,,  Endo/Other  diabetes    Renal/GU Renal disease  negative genitourinary   Musculoskeletal  (+) Arthritis ,    Abdominal   Peds negative pediatric ROS (+)  Hematology negative hematology ROS (+)   Anesthesia Other Findings Kidney stones  Hypertension Hyperlipidemia  Type 2 diabetes mellitus not well controlled   Arthritis   Wife states patient snores heavily, has sleep apnea. Not tested for sleep apnea. Long session counseling patient on risks of untreated sleep apnea.  Did not convince him to be tested, but I tried.   Reproductive/Obstetrics negative OB ROS                             Anesthesia Physical Anesthesia Plan  ASA: 3  Anesthesia Plan: General   Post-op Pain Management:    Induction: Intravenous  PONV Risk Score and Plan:   Airway Management Planned: LMA  Additional Equipment:   Intra-op Plan:   Post-operative Plan: Extubation in OR  Informed Consent: I have reviewed the patients History and Physical, chart, labs and discussed the procedure including the risks, benefits and alternatives for the proposed anesthesia with the patient or authorized representative who has indicated his/her understanding and acceptance.     Dental Advisory Given  Plan Discussed with:  Anesthesiologist, CRNA and Surgeon  Anesthesia Plan Comments: (Patient consented for risks of anesthesia including but not limited to:  - adverse reactions to medications - damage to eyes, teeth, lips or other oral mucosa - nerve damage due to positioning  - sore throat or hoarseness - Damage to heart, brain, nerves, lungs, other parts of body or loss of life  Patient voiced understanding and assent.)        Anesthesia Quick Evaluation

## 2023-06-09 NOTE — Discharge Instructions (Addendum)
[facility]  POST OPERATIVE INSTRUCTIONS FOR DR. Ether Griffins AND DR. Aceyn Kathol [facility] PODIATRY DEPARTMENT   Take your medication as prescribed.  Pain medication should be taken only as needed.  You may take additional Tylenol as needed, maximum dose of Tylenol per day is 4000 mg.  Stay below this.  If pain is still severe then recommend taking 1 pain tablet every 4 hours.  If still severe then try taking 2 pain tablets every 6 hours.  If still severe then try taking 2 pain tablets every 4 hours.  Keep the dressing clean, dry and intact.  Remain partial weightbearing at all time with right heel contact in cam boot.  You should only be on your foot for short distances and should not be on the foot for long periods of time.  If needing to be up and moving around recommend usage of crutches or knee scooter.  Progressively as the weeks go by be allowed to put more pressure to the foot.  Recommend for the first 2 weeks taking it very easy.  Keep your foot elevated above the heart level for the first 48 hours.  Continue elevation thereafter to improve swelling.  You may also apply ice to the top of the right foot or behind the right knee for maximum 10 minutes out of every 1 hour.  Walking to the bathroom and brief periods of walking are acceptable, unless we have instructed you to be non-weight bearing.  Always wear your post-op shoe when walking.  Always use your crutches if you are to be non-weight bearing.  Do not take a shower. Baths are permissible as long as the foot is kept out of the water.   Every hour you are awake:  Bend your knee 15 times. Massage calf 15 times  Call [facility] [phone]) if any of the following problems occur: You develop a temperature or fever. The bandage becomes saturated with blood. Medication does not stop your pain. Injury of the foot occurs. Any symptoms of infection including redness, odor, or red streaks  running from wound.

## 2023-06-15 ENCOUNTER — Other Ambulatory Visit: Payer: Self-pay | Admitting: Podiatry

## 2023-06-16 ENCOUNTER — Ambulatory Visit: Payer: Self-pay

## 2023-06-16 ENCOUNTER — Encounter: Payer: Self-pay | Admitting: Podiatry

## 2023-06-16 ENCOUNTER — Other Ambulatory Visit: Payer: Self-pay

## 2023-06-16 ENCOUNTER — Ambulatory Visit: Payer: BC Managed Care – PPO | Admitting: Anesthesiology

## 2023-06-16 ENCOUNTER — Ambulatory Visit
Admission: RE | Admit: 2023-06-16 | Discharge: 2023-06-16 | Disposition: A | Discharge status: Home / Self Care | Payer: BC Managed Care – PPO | Attending: Podiatry | Admitting: Podiatry

## 2023-06-16 ENCOUNTER — Encounter
Admission: RE | Disposition: A | Discharge status: Home / Self Care | Payer: Self-pay | Source: Home / Self Care | Attending: Podiatry

## 2023-06-16 DIAGNOSIS — I10 Essential (primary) hypertension: Secondary | ICD-10-CM | POA: Insufficient documentation

## 2023-06-16 DIAGNOSIS — M2021 Hallux rigidus, right foot: Secondary | ICD-10-CM | POA: Diagnosis present

## 2023-06-16 DIAGNOSIS — E119 Type 2 diabetes mellitus without complications: Secondary | ICD-10-CM | POA: Diagnosis not present

## 2023-06-16 HISTORY — PX: ARTHRODESIS METATARSALPHALANGEAL JOINT (MTPJ): SHX6566

## 2023-06-16 HISTORY — DX: Essential (primary) hypertension: I10

## 2023-06-16 HISTORY — DX: Unspecified osteoarthritis, unspecified site: M19.90

## 2023-06-16 HISTORY — DX: Hyperlipidemia, unspecified: E78.5

## 2023-06-16 HISTORY — DX: Type 2 diabetes mellitus without complications: E11.9

## 2023-06-16 LAB — GLUCOSE, CAPILLARY: Glucose-Capillary: 189 mg/dL — ABNORMAL HIGH (ref 70–99)

## 2023-06-16 SURGERY — FUSION, JOINT, GREAT TOE
Anesthesia: General | Site: Toe | Laterality: Right

## 2023-06-16 MED ORDER — AMOXICILLIN-POT CLAVULANATE 875-125 MG PO TABS: 1.0000 | ORAL_TABLET | Freq: Two times a day (BID) | ORAL | 0 refills | Status: DC

## 2023-06-16 MED ORDER — OXYCODONE-ACETAMINOPHEN 5-325 MG PO TABS: 1.0000 | ORAL_TABLET | Freq: Four times a day (QID) | ORAL | 0 refills | Status: AC | PRN

## 2023-06-16 MED ORDER — DEXAMETHASONE SODIUM PHOSPHATE 10 MG/ML IJ SOLN
INTRAMUSCULAR | Status: DC | PRN
  Administered 2023-06-16 (×2): 4 mg via INTRAVENOUS

## 2023-06-16 MED ORDER — BUPIVACAINE LIPOSOME 1.3 % IJ SUSP
INTRAMUSCULAR | Status: DC | PRN
  Administered 2023-06-16: 11 mL

## 2023-06-16 MED ORDER — 0.9 % SODIUM CHLORIDE (POUR BTL) OPTIME
TOPICAL | Status: DC | PRN
  Administered 2023-06-16: 240 mL

## 2023-06-16 MED ORDER — ASPIRIN 81 MG PO TBEC: 81.0000 mg | DELAYED_RELEASE_TABLET | Freq: Two times a day (BID) | ORAL | 0 refills | Status: AC

## 2023-06-16 MED ORDER — LACTATED RINGERS IV SOLN: INTRAVENOUS | Status: DC

## 2023-06-16 MED ORDER — CEFAZOLIN SODIUM 1 G IJ SOLR
INTRAMUSCULAR | Status: AC
  Filled 2023-06-16: qty 30

## 2023-06-16 MED ORDER — FENTANYL CITRATE (PF) 100 MCG/2ML IJ SOLN
INTRAMUSCULAR | Status: AC
  Filled 2023-06-16: qty 2

## 2023-06-16 MED ORDER — SODIUM CHLORIDE 0.9% FLUSH
INTRAVENOUS | Status: DC | PRN
Start: 2023-06-16 — End: 2023-06-16
  Administered 2023-06-16 (×6): 10 mL via INTRAVENOUS

## 2023-06-16 MED ORDER — FENTANYL CITRATE (PF) 100 MCG/2ML IJ SOLN
INTRAMUSCULAR | Status: DC | PRN
  Administered 2023-06-16 (×3): 12.5 ug via INTRAVENOUS
  Administered 2023-06-16: 25 ug via INTRAVENOUS

## 2023-06-16 MED ORDER — PROPOFOL 10 MG/ML IV BOLUS
INTRAVENOUS | Status: DC | PRN
  Administered 2023-06-16: 200 mg via INTRAVENOUS

## 2023-06-16 MED ORDER — DEXTROSE 5 % IV SOLN
INTRAVENOUS | Status: DC | PRN
  Administered 2023-06-16: 3 g via INTRAVENOUS

## 2023-06-16 MED ORDER — ONDANSETRON HCL 4 MG/2ML IJ SOLN
INTRAMUSCULAR | Status: DC | PRN
Start: 2023-06-16 — End: 2023-06-16
  Administered 2023-06-16: 4 mg via INTRAVENOUS

## 2023-06-16 MED ORDER — STERILE WATER FOR INJECTION IJ SOLN
INTRAMUSCULAR | Status: AC
  Filled 2023-06-16: qty 10

## 2023-06-16 MED ORDER — MIDAZOLAM HCL 2 MG/2ML IJ SOLN
INTRAMUSCULAR | Status: DC | PRN
  Administered 2023-06-16: .5 mg via INTRAVENOUS
  Administered 2023-06-16: 1 mg via INTRAVENOUS
  Administered 2023-06-16: .5 mg via INTRAVENOUS

## 2023-06-16 MED ORDER — CEFAZOLIN IN SODIUM CHLORIDE 3-0.9 GM/100ML-% IV SOLN: 3.0000 g | INTRAVENOUS | Status: DC

## 2023-06-16 MED ORDER — DEXMEDETOMIDINE HCL IN NACL 200 MCG/50ML IV SOLN
INTRAVENOUS | Status: DC | PRN
Start: 2023-06-16 — End: 2023-06-16
  Administered 2023-06-16: 4 ug via INTRAVENOUS

## 2023-06-16 MED ORDER — BUPIVACAINE HCL 0.5 % IJ SOLN
INTRAMUSCULAR | Status: DC | PRN
  Administered 2023-06-16: 20 mL

## 2023-06-16 MED ORDER — LIDOCAINE HCL (CARDIAC) PF 100 MG/5ML IV SOSY
PREFILLED_SYRINGE | INTRAVENOUS | Status: DC | PRN
  Administered 2023-06-16: 40 mg via INTRAVENOUS

## 2023-06-16 SURGICAL SUPPLY — 62 items
BIT DRILL 2 FENESTRATED (MISCELLANEOUS) IMPLANT
BIT DRILL 2.4X140 LONG SOLID (BIT) IMPLANT
BIT DRILL CANNULTD 2.6 X 130MM (DRILL) IMPLANT
BLADE MED AGGRESSIVE (BLADE) IMPLANT
BNDG CMPR STD VLCR NS LF 5.8X4 (GAUZE/BANDAGES/DRESSINGS) ×1
BNDG CMPR STD VLCR NS LF 5.8X6 (GAUZE/BANDAGES/DRESSINGS) ×1
BNDG ELASTIC 4X5.8 VLCR NS LF (GAUZE/BANDAGES/DRESSINGS) ×1 IMPLANT
BNDG ELASTIC 6X5.8 VLCR NS LF (GAUZE/BANDAGES/DRESSINGS) ×1 IMPLANT
BNDG ESMARCH 4 X 12 STRL LF (GAUZE/BANDAGES/DRESSINGS) ×1
BNDG ESMARCH 4X12 STRL LF (GAUZE/BANDAGES/DRESSINGS) ×1 IMPLANT
BNDG GAUZE DERMACEA FLUFF 4 (GAUZE/BANDAGES/DRESSINGS) ×1 IMPLANT
BNDG GZE DERMACEA 4 6PLY (GAUZE/BANDAGES/DRESSINGS) ×1
BOOT STEPPER DURA MED (SOFTGOODS) IMPLANT
CANISTER SUCT 1200ML W/VALVE (MISCELLANEOUS) ×1 IMPLANT
COUNTERSICK 4.0 HEADED (MISCELLANEOUS) ×1
COVER LIGHT HANDLE UNIVERSAL (MISCELLANEOUS) ×2 IMPLANT
DRAPE FLUOR MINI C-ARM 54X84 (DRAPES) ×1 IMPLANT
DRILL CANNULATED 2.6 X 130MM (DRILL) ×1
DURAPREP 26ML APPLICATOR (WOUND CARE) ×1 IMPLANT
ELECT REM PT RETURN 9FT ADLT (ELECTROSURGICAL) ×1
ELECTRODE REM PT RTRN 9FT ADLT (ELECTROSURGICAL) ×1 IMPLANT
GAUZE SPONGE 4X4 12PLY STRL (GAUZE/BANDAGES/DRESSINGS) ×1 IMPLANT
GAUZE XEROFORM 1X8 LF (GAUZE/BANDAGES/DRESSINGS) ×1 IMPLANT
GLOVE BIOGEL PI IND STRL 7.5 (GLOVE) ×1 IMPLANT
GLOVE SURG SS PI 7.0 STRL IVOR (GLOVE) ×1 IMPLANT
GOWN STRL REUS W/ TWL LRG LVL3 (GOWN DISPOSABLE) ×2 IMPLANT
GOWN STRL REUS W/TWL LRG LVL3 (GOWN DISPOSABLE) ×2
GRAFT DMB PUTTY 1 OPTIUM FD (Bone Implant) IMPLANT
K-WIRE SMOOTH 1.6X150MM (WIRE) ×2
K-WIRE SNGL END 1.2X150 (MISCELLANEOUS) ×2
KIT TURNOVER KIT A (KITS) ×1 IMPLANT
KWIRE SMOOTH 1.6X150MM (WIRE) IMPLANT
KWIRE SNGL END 1.2X150 (MISCELLANEOUS) IMPLANT
NS IRRIG 500ML POUR BTL (IV SOLUTION) ×1 IMPLANT
PACK EXTREMITY ARMC (MISCELLANEOUS) ×1 IMPLANT
PADDING CAST BLEND 4X4 NS (MISCELLANEOUS) ×3 IMPLANT
PLATE MTP 0DEG RIGHT (Plate) IMPLANT
PUTTY DBM OPTIUM 1CC (Bone Implant) ×1 IMPLANT
REAMER FEMALE 23 (MISCELLANEOUS) IMPLANT
REAMER MALE 23 (MISCELLANEOUS) IMPLANT
REAMER SLEEVE 23 (SLEEVE) IMPLANT
SCREW 4.0X30MM (Screw) ×1 IMPLANT
SCREW BN ST 30X4XCANN HD (Screw) IMPLANT
SCREW COUNTERSINK 4.0 HEADED (MISCELLANEOUS) IMPLANT
SCREW LOCK PLATE R3 3.5X12 (Screw) IMPLANT
SCREW LOCK PLATE R3 3.5X14 (Screw) IMPLANT
SCREW LOCK PLATE R3 3.5X16 (Screw) IMPLANT
SCREW NON LOCKING 3.5X20 (Screw) IMPLANT
SPLINT CAST 1 STEP 4X30 (MISCELLANEOUS) ×1 IMPLANT
STOCKINETTE IMPERVIOUS LG (DRAPES) ×1 IMPLANT
SUT ETHILON 4-0 (SUTURE)
SUT ETHILON 4-0 FS2 18XMFL BLK (SUTURE)
SUT MNCRL+ 5-0 UNDYED PC-3 (SUTURE) IMPLANT
SUT VIC AB 0 CT1 27 (SUTURE)
SUT VIC AB 0 CT1 27XCR 8 STRN (SUTURE) IMPLANT
SUT VIC AB 2-0 SH 27 (SUTURE)
SUT VIC AB 2-0 SH 27XBRD (SUTURE) IMPLANT
SUT VIC AB 3-0 SH 27 (SUTURE)
SUT VIC AB 3-0 SH 27X BRD (SUTURE) IMPLANT
SUT VIC AB 4-0 FS2 27 (SUTURE) IMPLANT
SUTURE ETHLN 4-0 FS2 18XMF BLK (SUTURE) IMPLANT
WIRE OLIVE SMOOTH 1.4MMX60MM (WIRE) IMPLANT

## 2023-06-16 NOTE — Anesthesia Postprocedure Evaluation (Signed)
Anesthesia Post Note  Patient: Andrew Roach  Procedure(s) Performed: ARTHRODESIS METATARSALPHALANGEAL JOINT (MTPJ) FIRST (Right: Toe)  Patient location during evaluation: PACU Anesthesia Type: General Level of consciousness: awake and alert Pain management: pain level controlled Vital Signs Assessment: post-procedure vital signs reviewed and stable Respiratory status: spontaneous breathing, nonlabored ventilation, respiratory function stable and patient connected to nasal cannula oxygen Cardiovascular status: blood pressure returned to baseline and stable Postop Assessment: no apparent nausea or vomiting Anesthetic complications: no   No notable events documented.   Last Vitals:  Vitals:   06/16/23 1245 06/16/23 1300  BP: 109/69 111/74  Pulse: (!) 53 (!) 54  Resp: 13 14  Temp: (!) 36.2 C   SpO2: 93% 94%    Last Pain:  Vitals:   06/16/23 1245  TempSrc:   PainSc: 0-No pain                 Chistine Dematteo C Ladine Kiper

## 2023-06-16 NOTE — Transfer of Care (Signed)
Immediate Anesthesia Transfer of Care Note  Patient: Andrew Roach  Procedure(s) Performed: ARTHRODESIS METATARSALPHALANGEAL JOINT (MTPJ) FIRST (Right: Toe)  Patient Location: PACU  Anesthesia Type:General  Level of Consciousness: awake, alert , and oriented  Airway & Oxygen Therapy: Patient Spontanous Breathing and Patient connected to nasal cannula oxygen  Post-op Assessment: Report given to RN and Post -op Vital signs reviewed and stable  Post vital signs: Reviewed and stable  Last Vitals:  Vitals Value Taken Time  BP 126/90 06/16/23 1212  Temp 97.1 06/16/23 1214  Pulse 61 06/16/23 1214  Resp 17 06/16/23 1214  SpO2 97 % 06/16/23 1214  Vitals shown include unfiled device data.  Last Pain:  Vitals:   06/16/23 0850  TempSrc: Temporal  PainSc: 4          Complications: No notable events documented.

## 2023-06-16 NOTE — Op Note (Signed)
PODIATRY / FOOT AND ANKLE SURGERY OPERATIVE REPORT    SURGEON: [name], DPM  PRE-OPERATIVE DIAGNOSIS:  1.  Right hallux rigidus  POST-OPERATIVE DIAGNOSIS: Same  PROCEDURE(S): Right first metatarsal phalange joint fusion  HEMOSTASIS: Right ankle tourniquet  ANESTHESIA: MAC  ESTIMATED BLOOD LOSS: 2 cc  FINDING(S): 1.  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 male who presents with pain and discomfort to the first metatarsal phalangeal of the right foot.  Patient has exhausted conservative measures and presents today for surgical intervention consisting of right first metatarsal phalange joint fusion.  All treatment options were discussed with the patient both conservative and surgical attempts at correction include potential risks and complications at this time patient is elected for surgical intervention described below.  Consent obtained prior to procedure.  No guarantees given..  DESCRIPTION: After obtaining full informed written consent, the patient was brought back to the operating room and placed supine upon the operating table.  The patient received IV antibiotics prior to induction.  After obtaining adequate anesthesia, the patient was prepped and draped in the standard fashion.  20 cc of half percent Marcaine plain was injected about the right first ray in a Mayo type block.  Attention was then directed to the right first metatarsal phalangeal joint where a linear longitudinal incision was made medial to the tendon of the extensor hallucis longus.  The incision was deepened to the subcutaneous tissues utilizing sharp blunt dissection and care was taken to identify and retract all vital neural and vascular structures and all venous contributories were cauterized necessary.  At this time a capsular and periosteal incision was made medial to the tendon of the extensor hallucis  longus.  The capsular and periosteal tissue was reflected medially and laterally at the operative site thereby exposing the first metatarsal phalange joint at the operative site.  There appeared to be a large amount of joint mice present at the dorsal aspect of the joint which was resected with a rongeur.  There appeared to be a slight medial eminence and dorsal exostosis which appeared to be large which was resected with a sagittal bone saw and passed off in the operative site.  The joint surface was then contoured further removing any further bone spurs fairly circumferentially.  A McGlamery elevator was then used to free up any plantar adhesions.  At this time the guide for the reamer for the first metatarsal was placed with the appropriate orientation.  The reamer was then used to ream off any further articular cartilage present to the joint which there appeared to be very minimal overall.  This was debrided down to healthy subchondral bleeding bone.  The pin was then removed and placed into the base the proximal phalanx but the reamer was unable to be placed in the area due to the tightness of the structures.  So a hand curettage was then performed removing any further articular cartilage in the base of the proximal phalanx of the hallux.  The pin was removed during that process.  The surgical site was flushed with copious amounts normal sterile saline.  Fenestration was then performed with a 2.0 drill bit.  The toe was then held in a rectus position and then temporary fixated with a K wire and held in appropriate position such that the second toe did not touch the big toe and the big toe barely touch the floor with simulated weightbearing.  This was checked under fluoroscopic guidance and appeared to have appropriate orientation overall.  The first metatarsal phalange joint fusion plate from [employer] was placed across the fusion site and held with temporary fixation olive wires.  C-arm imaging was utilized  to verify correct position of plate which appeared to be excellent.  At this time the guide for the compression screw was then placed.  The compression screw was then placed at the base of the proximal phalanx medially of the hallux across the first metatarsal phalange joint and into the first metatarsal head and neck area from medial to lateral.  This was checked under fluoroscopic guidance and appeared to be appropriate.  At this time utilizing standard AO principles and techniques a 3.5 x 30 mm partially-threaded headed compression screw was placed with excellent compression noted.  C-arm imaging was utilized to verify correction which appeared to be excellent.  At this time the distal locking screws then placed through the plate which were all 3.5 locking screws.  While tightening one of the locking screws it did appear to crack the area where the lag screw was placed.  There still appeared to be good compression across the area overall so it was left intact.  Further fixation was obtained and compression using the eccentric drill hole.  Where a 3.5 x 20 mm screw was placed.  This appeared to sit the plate down in excellent position and further compression was obtained across the first metatarsal phalange joint.  Rigid fixation was obtained.  2 further locking screws then placed into the first metatarsal that were 3.5 millimeter screws by 16.  The surgical site was flushed with copious amounts normal sterile saline.  There appeared to be just slight gapping at the lateral aspect of the joint where the contour of the bones did not quite match up all the way so a little bit of DBM was placed in this area to further stimulate healing across the area.  Final C-arm imaging was then taken.  This appeared to show excellent position of the first metatarsal phalange joint and good compression across the joint.  The Periosteal Tissue Was Reapproximated Well Coapted with 3-0 Vicryl.  The Subcutaneous Tissue Was  Reapproximated Well Coapted with 4-0 Vicryl.  The Skin Was Then Reapproximated Well Coapted with 3-0 Nylon in Horizontal Mattress Type Stitching.  The Pneumatic Ankle Tourniquet Was Deflated and Prompt Hyperemic Response Was Noted All to the Right Foot.  The Patient Tolerated the Procedure and Anesthesia Well and Was Transferred to Recovery in [place] Stable Vascular Status Intact All Toes the Right Foot.  A Postoperative Cam Boot Was Applied.  Patient Is to Remain Partial Weightbearing with Heel Contact in the Cam Boot for Only Short Distances and Should Not Be on Foot for Long Period of Time and Keep Dressings Clean, Dry, and Intact until Postoperative Visit 1.  Patient Be Discharged with the Appropriate Orders, Instructions, and Medications.  COMPLICATIONS: None  CONDITION: Good, stable  [name], DPM

## 2023-06-16 NOTE — H&P (Signed)
HISTORY AND PHYSICAL INTERVAL NOTE:  06/16/2023  10:03 AM  Andrew Roach  has presented today for surgery, with the diagnosis of M20.21 - Hallux rigidus of right foot.  The various methods of treatment have been discussed with the patient.  No guarantees were given.  After consideration of risks, benefits and other options for treatment, the patient has consented to surgery.  I have reviewed the patients' chart and labs.    PROCEDURE: RIGHT 1ST MTPJ ARTHRODESIS  A history and physical examination was performed in my office.  The patient was reexamined.  There have been no changes to this history and physical examination.  [name], DPM

## 2023-06-16 NOTE — Anesthesia Procedure Notes (Signed)
Procedure Name: LMA Insertion Date/Time: 06/16/2023 10:34 AM  Performed by: Genia Del, CRNAPre-anesthesia Checklist: Patient identified, Patient being monitored, Timeout performed, Emergency Drugs available and Suction available Patient Re-evaluated:Patient Re-evaluated prior to induction Oxygen Delivery Method: Circle system utilized Preoxygenation: Pre-oxygenation with 100% oxygen Induction Type: IV induction Ventilation: Mask ventilation without difficulty LMA: LMA inserted Laryngoscope Size: 5 Tube type: Oral Number of attempts: 1 Placement Confirmation: positive ETCO2 and breath sounds checked- equal and bilateral Tube secured with: Tape Dental Injury: Teeth and Oropharynx as per pre-operative assessment  Comments: Eyes taped closed prior to LMA placement after pt asleep.   Easy placement with good seal of LMA.

## 2023-06-20 NOTE — Plan of Care (Signed)
[facility] Tonsillectomy/Adenoidectomy, Postoperative PEDS care plan entered in error.

## 2023-06-21 ENCOUNTER — Encounter: Payer: Self-pay | Admitting: Podiatry

## 2023-09-02 NOTE — Progress Notes (Signed)
 Established Patient Visit:  Date of Service: 09/05/2023  CC:  Chief Complaint  Patient presents with  . Post Operative Visit    POST-OP - POV 5/ SX 10/17    Andrew Roach is a 58 y.o. who presents status post 10 weeks after undergoing a right first metatarsal phalange joint fusion.  He has been weightbearing as tolerated in his regular shoe.  Patient rates his pain today at 0/10.  PMHx: No past medical history on file.  Medication: Current Outpatient Medications on File Prior to Visit  Medication Sig Dispense Refill  . cholecalciferol (VITAMIN D3) 2,000 unit capsule Take 1 capsule by mouth once daily    . cholecalciferol (VITAMIN D3) 2,000 unit tablet Take 2,000 Units by mouth once daily    . dulaglutide (TRULICITY) 0.75 mg/0.5 mL subcutaneous pen injector Inject 0.75 mg subcutaneously once a week    . JANUVIA 100 mg tablet Take 1 tablet by mouth once daily    . lisinopriL (ZESTRIL) 20 MG tablet Take 1 tablet every day by oral route for 90 days.    . meloxicam (MOBIC) 15 MG tablet Take by mouth    . metFORMIN (GLUCOPHAGE) 1000 MG tablet Take 1 tablet twice a day by oral route for 90 days.    . rosuvastatin (CRESTOR) 10 MG tablet Take 1 tablet by mouth once daily    . rosuvastatin (CRESTOR) 10 MG tablet Take 10 mg by mouth once daily    . TRULICITY 0.75 mg/0.5 mL subcutaneous pen injector Inject 0.5 mL every week by subcutaneous route for 28 days.     No current facility-administered medications on file prior to visit.    Allergies: Allergies as of 09/05/2023 - Reviewed 09/05/2023  Allergen Reaction Noted  . Shellfish containing products Anaphylaxis 06/07/2023    Social History: Social History   Socioeconomic History  . Marital status: Married  Tobacco Use  . Smoking status: Never  . Smokeless tobacco: Never   Social Drivers of Corporate Investment Banker Strain: High Risk (08/03/2023)   Overall Financial Resource Strain (CARDIA)   . Difficulty of Paying Living  Expenses: Hard  Food Insecurity: Food Insecurity Present (08/03/2023)   Hunger Vital Sign   . Worried About Programme Researcher, Broadcasting/film/video in the Last Year: Sometimes true   . Ran Out of Food in the Last Year: Sometimes true  Transportation Needs: No Transportation Needs (08/03/2023)   PRAPARE - Transportation   . Lack of Transportation (Medical): No   . Lack of Transportation (Non-Medical): No  Housing Stability: High Risk (09/05/2023)   Housing Stability Vital Sign   . Unable to Pay for Housing in the Last Year: Yes   . Homeless in the Last Year: No   Social History   Tobacco Use  Smoking Status Never  Smokeless Tobacco Never    Family History: No family history on file.   Surgical History: History reviewed. No pertinent surgical history.   Review of Systems:  A comprehensive 14 point ROS was performed, reviewed, and the pertinent orthopaedic findings are documented in the HPI.  Objective: General/Constitutional: No apparent distress: well-nourished and well developed.  Psych: Normal mood and affect, oriented to person, place and time (AOx3)  Vascular: DP/PT pulses intact bil, CFT<3s to digits bil, hair growth to digits bil.  Mild swelling to the right forefoot.  Neuro: Light touch sensation reduced to digits bil  Derm: Incision site to the right first metatarsal phalange joint appears to be completely healed still at this  time with no openings, mild edema, no erythema or drainage.  MSK: Right hallux appears to be in rectus position.  No pain on palpation of the right first metatarsal phalange joint, stressed right first metatarsal phalange joint with attempted range of motion and appears to have rigid fixation and does not produce pain or discomfort.  X-rays: Right foot 3 views AP, lateral, lateral oblique: Rectus position of hallux status post first metatarsal phalange joint arthrodesis.  Plate and screw fixation appears to be intact with screws and plate of the appropriate size and  length.  Excellent compression across the first metatarsal phalangeal joint.  Appears to have further bony consolidation across the fusion site at this time.  Assessment: Encounter Diagnoses  Name Primary?  . Postop check Yes  . Hallux rigidus of right foot   . Type 2 diabetes mellitus with polyneuropathy (CMS/HHS-HCC)     Plan: -Pt was seen and examined. -X-ray imaging reviewed and discussed with patient in detail.  Appears to have clinical and radiographic union today. -Incision site appears well-healed with no openings. -Patient may continue wearing good and supportive shoes with arch supports.  May weight-bear as tolerated and increase activities as tolerated. -Instructed to continue RICE therapy with use of Tylenol  or ibuprofen as needed. -No restrictions at this point.  Patient released to work.  Return if symptoms worsen or fail to improve.  Orders Placed This Encounter  Procedures  . X-ray foot right 3 plus views    Prentice Ozell Lee, DPM

## 2024-04-24 ENCOUNTER — Emergency Department (HOSPITAL_COMMUNITY)
Admission: EM | Admit: 2024-04-24 | Discharge: 2024-04-25 | Disposition: A | Discharge status: Home / Self Care | Attending: Emergency Medicine | Admitting: Emergency Medicine

## 2024-04-24 ENCOUNTER — Other Ambulatory Visit: Payer: Self-pay

## 2024-04-24 ENCOUNTER — Encounter (HOSPITAL_COMMUNITY): Payer: Self-pay

## 2024-04-24 ENCOUNTER — Emergency Department (HOSPITAL_COMMUNITY)

## 2024-04-24 DIAGNOSIS — I10 Essential (primary) hypertension: Secondary | ICD-10-CM | POA: Diagnosis not present

## 2024-04-24 DIAGNOSIS — G8929 Other chronic pain: Secondary | ICD-10-CM | POA: Insufficient documentation

## 2024-04-24 DIAGNOSIS — R252 Cramp and spasm: Secondary | ICD-10-CM | POA: Insufficient documentation

## 2024-04-24 DIAGNOSIS — Z7984 Long term (current) use of oral hypoglycemic drugs: Secondary | ICD-10-CM | POA: Diagnosis not present

## 2024-04-24 DIAGNOSIS — N201 Calculus of ureter: Secondary | ICD-10-CM

## 2024-04-24 DIAGNOSIS — Z79899 Other long term (current) drug therapy: Secondary | ICD-10-CM | POA: Insufficient documentation

## 2024-04-24 DIAGNOSIS — R109 Unspecified abdominal pain: Secondary | ICD-10-CM | POA: Diagnosis present

## 2024-04-24 DIAGNOSIS — N202 Calculus of kidney with calculus of ureter: Secondary | ICD-10-CM | POA: Insufficient documentation

## 2024-04-24 DIAGNOSIS — E139 Other specified diabetes mellitus without complications: Secondary | ICD-10-CM | POA: Insufficient documentation

## 2024-04-24 DIAGNOSIS — E785 Hyperlipidemia, unspecified: Secondary | ICD-10-CM | POA: Insufficient documentation

## 2024-04-24 DIAGNOSIS — M1990 Unspecified osteoarthritis, unspecified site: Secondary | ICD-10-CM | POA: Insufficient documentation

## 2024-04-24 DIAGNOSIS — E119 Type 2 diabetes mellitus without complications: Secondary | ICD-10-CM | POA: Diagnosis not present

## 2024-04-24 DIAGNOSIS — M159 Polyosteoarthritis, unspecified: Secondary | ICD-10-CM | POA: Insufficient documentation

## 2024-04-24 LAB — BASIC METABOLIC PANEL WITH GFR
Anion gap: 15 (ref 5–15)
BUN: 19 mg/dL (ref 6–20)
CO2: 21 mmol/L — ABNORMAL LOW (ref 22–32)
Calcium: 9.1 mg/dL (ref 8.9–10.3)
Chloride: 100 mmol/L (ref 98–111)
Creatinine, Ser: 1.38 mg/dL — ABNORMAL HIGH (ref 0.61–1.24)
GFR, Estimated: 59 mL/min — ABNORMAL LOW (ref >60)
Glucose, Bld: 244 mg/dL — ABNORMAL HIGH (ref 70–99)
Potassium: 4.2 mmol/L (ref 3.5–5.1)
Sodium: 136 mmol/L (ref 135–145)

## 2024-04-24 LAB — CBC WITH DIFFERENTIAL/PLATELET
Abs Immature Granulocytes: 0.03 K/uL (ref 0.00–0.07)
Basophils Absolute: 0.1 K/uL (ref 0.0–0.1)
Basophils Relative: 1 %
Eosinophils Absolute: 0.1 K/uL (ref 0.0–0.5)
Eosinophils Relative: 1 %
HCT: 45.9 % (ref 39.0–52.0)
Hemoglobin: 16.2 g/dL (ref 13.0–17.0)
Immature Granulocytes: 0 %
Lymphocytes Relative: 28 %
Lymphs Abs: 2.8 K/uL (ref 0.7–4.0)
MCH: 32.5 pg (ref 26.0–34.0)
MCHC: 35.3 g/dL (ref 30.0–36.0)
MCV: 92 fL (ref 80.0–100.0)
Monocytes Absolute: 0.8 K/uL (ref 0.1–1.0)
Monocytes Relative: 8 %
Neutro Abs: 6.2 K/uL (ref 1.7–7.7)
Neutrophils Relative %: 62 %
Platelets: 282 K/uL (ref 150–400)
RBC: 4.99 MIL/uL (ref 4.22–5.81)
RDW: 12 % (ref 11.5–15.5)
WBC: 10 K/uL (ref 4.0–10.5)
nRBC: 0 % (ref 0.0–0.2)

## 2024-04-24 MED ORDER — HYDROMORPHONE HCL 1 MG/ML IJ SOLN
0.5000 mg | Freq: Once | INTRAMUSCULAR | Status: AC
  Administered 2024-04-24: 0.5 mg via INTRAVENOUS
  Filled 2024-04-24: qty 0.5

## 2024-04-24 MED ORDER — ONDANSETRON HCL 4 MG/2ML IJ SOLN
4.0000 mg | Freq: Once | INTRAMUSCULAR | Status: AC
  Administered 2024-04-24: 4 mg via INTRAVENOUS
  Filled 2024-04-24: qty 2

## 2024-04-24 MED ORDER — KETOROLAC TROMETHAMINE 30 MG/ML IJ SOLN
30.0000 mg | Freq: Once | INTRAMUSCULAR | Status: AC
  Administered 2024-04-24: 30 mg via INTRAVENOUS
  Filled 2024-04-24: qty 1

## 2024-04-24 NOTE — ED Triage Notes (Signed)
 Pt reports left sided flank pain radiating around to back with hx of kidney stones. Pt says pain started a few hours ago and he is having difficulty urinating.

## 2024-04-25 LAB — URINALYSIS, ROUTINE W REFLEX MICROSCOPIC: Bilirubin Urine: NEGATIVE

## 2024-04-25 LAB — URINALYSIS, MICROSCOPIC (REFLEX): RBC / HPF: >50 RBC/hpf (ref 0–5)

## 2024-04-25 MED ORDER — OXYCODONE-ACETAMINOPHEN 5-325 MG PO TABS: 1.0000 | ORAL_TABLET | Freq: Four times a day (QID) | ORAL | 0 refills | Status: DC | PRN

## 2024-04-25 MED ORDER — ONDANSETRON HCL 4 MG PO TABS: 4.0000 mg | ORAL_TABLET | Freq: Four times a day (QID) | ORAL | 0 refills | Status: DC

## 2024-04-25 MED ORDER — TAMSULOSIN HCL 0.4 MG PO CAPS: 0.4000 mg | ORAL_CAPSULE | Freq: Every day | ORAL | 0 refills | Status: DC

## 2024-04-25 MED ORDER — OXYCODONE-ACETAMINOPHEN 5-325 MG PO TABS: 1.0000 | ORAL_TABLET | ORAL | 0 refills | Status: DC | PRN

## 2024-04-25 NOTE — ED Provider Notes (Signed)
 Care assumed from [facility], [place], patient with left-sided kidney stone waiting for urinalysis.  Urinalysis shows no evidence of UTI.  I am discharging him with urology follow-up.  Return precautions discussed.  Results for orders placed or performed during the hospital encounter of 04/24/24  CBC with Differential   Collection Time: 04/24/24  9:48 PM  Result Value Ref Range   WBC 10.0 4.0 - 10.5 K/uL   RBC 4.99 4.22 - 5.81 MIL/uL   Hemoglobin 16.2 13.0 - 17.0 g/dL   HCT 54.0 60.9 - 47.9 %   MCV 92.0 80.0 - 100.0 fL   MCH 32.5 26.0 - 34.0 pg   MCHC 35.3 30.0 - 36.0 g/dL   RDW 87.9 88.4 - 84.4 %   Platelets 282 150 - 400 K/uL   nRBC 0.0 0.0 - 0.2 %   Neutrophils Relative % 62 %   Neutro Abs 6.2 1.7 - 7.7 K/uL   Lymphocytes Relative 28 %   Lymphs Abs 2.8 0.7 - 4.0 K/uL   Monocytes Relative 8 %   Monocytes Absolute 0.8 0.1 - 1.0 K/uL   Eosinophils Relative 1 %   Eosinophils Absolute 0.1 0.0 - 0.5 K/uL   Basophils Relative 1 %   Basophils Absolute 0.1 0.0 - 0.1 K/uL   Immature Granulocytes 0 %   Abs Immature Granulocytes 0.03 0.00 - 0.07 K/uL  Basic metabolic panel   Collection Time: 04/24/24  9:48 PM  Result Value Ref Range   Sodium 136 135 - 145 mmol/L   Potassium 4.2 3.5 - 5.1 mmol/L   Chloride 100 98 - 111 mmol/L   CO2 21 (L) 22 - 32 mmol/L   Glucose, Bld 244 (H) 70 - 99 mg/dL   BUN 19 6 - 20 mg/dL   Creatinine, Ser 8.61 (H) 0.61 - 1.24 mg/dL   Calcium 9.1 8.9 - 89.6 mg/dL   GFR, Estimated 59 (L) >60 mL/min   Anion gap 15 5 - 15  Urinalysis, Routine w reflex microscopic -Urine, Clean Catch   Collection Time: 04/25/24 12:09 AM  Result Value Ref Range   Color, Urine (A) YELLOW    TEST NOT REPORTED DUE TO COLOR INTERFERENCE OF URINE PIGMENT   APPearance TURBID (A) CLEAR   Specific Gravity, Urine  1.005 - 1.030    TEST NOT REPORTED DUE TO COLOR INTERFERENCE OF URINE PIGMENT   pH  5.0 - 8.0    TEST NOT REPORTED DUE TO COLOR INTERFERENCE OF URINE PIGMENT    Glucose, UA (A) NEGATIVE mg/dL    TEST NOT REPORTED DUE TO COLOR INTERFERENCE OF URINE PIGMENT   Hgb urine dipstick (A) NEGATIVE    TEST NOT REPORTED DUE TO COLOR INTERFERENCE OF URINE PIGMENT   Bilirubin Urine NEGATIVE NEGATIVE   Ketones, ur (A) NEGATIVE mg/dL    TEST NOT REPORTED DUE TO COLOR INTERFERENCE OF URINE PIGMENT   Protein, ur (A) NEGATIVE mg/dL    TEST NOT REPORTED DUE TO COLOR INTERFERENCE OF URINE PIGMENT   Nitrite (A) NEGATIVE    TEST NOT REPORTED DUE TO COLOR INTERFERENCE OF URINE PIGMENT   Leukocytes,Ua (A) NEGATIVE    TEST NOT REPORTED DUE TO COLOR INTERFERENCE OF URINE PIGMENT  Urinalysis, Microscopic (reflex)   Collection Time: 04/25/24 12:09 AM  Result Value Ref Range   RBC / HPF >50 0 - 5 RBC/hpf   WBC, UA 6-10 0 - 5 WBC/hpf   Bacteria, UA FEW (A) NONE SEEN   Squamous Epithelial / HPF 0-5 0 -  5 /HPF   Ca Oxalate Crys, UA PRESENT       Raford Lenis, MD 04/25/24 [phone]

## 2024-04-25 NOTE — ED Provider Notes (Signed)
 [place] EMERGENCY DEPARTMENT AT [facility] Provider Note   CSN: [id number] Arrival date & time: 04/24/24  2055     Patient presents with: Flank Pain   Andrew Roach is a 58 y.o. male.    Flank Pain Pertinent negatives include no chest pain, no abdominal pain, no headaches and no shortness of breath.       Andrew Roach is a 58 y.o. male with past medical history of hypertension, type 2 diabetes, and recurrent kidney stones who presents to the Emergency Department complaining of sudden onset left flank pain earlier today.  Describes sharp pain from his left flank that radiates to his groin area.  Pain has been associated with nausea vomiting and difficulty urinating.  States current pain feels similar to previous kidney stones.  He states he has had difficulty urinating with previous kidney stones as well.  He denies any fevers chills chest pain or shortness of breath.  Prior to Admission medications   Medication Sig Start Date End Date Taking? Authorizing Provider  amoxicillin -clavulanate (AUGMENTIN ) 875-125 MG tablet Take 1 tablet by mouth 2 (two) times daily. 06/16/23   Lennie Barter, DPM  Cholecalciferol (VITAMIN D3) 50 MCG (2000 UT) TABS Take by mouth daily.    [provider]  Dulaglutide (TRULICITY) 0.75 MG/0.5ML SOPN Inject 0.75 mg into the skin once a week. Saturday or Sunday    [provider]  lisinopril (ZESTRIL) 20 MG tablet Take 20 mg by mouth daily.    [provider]  metFORMIN (GLUCOPHAGE) 1000 MG tablet Take 1,000 mg by mouth daily.    [provider]  ondansetron  (ZOFRAN ) 4 MG tablet Take 1 tablet (4 mg total) by mouth every 6 (six) hours. Patient not taking: Reported on 06/07/2023 01/27/23   Horton, Kristie M, DO  rosuvastatin (CRESTOR) 10 MG tablet Take 10 mg by mouth daily.    [provider]  sitaGLIPtin (JANUVIA) 100 MG tablet Take 100 mg by mouth daily.    [provider]  tamsulosin  (FLOMAX )  0.4 MG CAPS capsule Take 1 capsule (0.4 mg total) by mouth daily. Patient not taking: Reported on 06/07/2023 01/27/23   Horton, Roxie HERO, DO    Allergies: Shellfish allergy    Review of Systems  Constitutional:  Negative for appetite change, chills and fever.  Respiratory:  Negative for shortness of breath.   Cardiovascular:  Negative for chest pain.  Gastrointestinal:  Positive for nausea and vomiting. Negative for abdominal pain.  Genitourinary:  Positive for decreased urine volume, difficulty urinating and flank pain. Negative for penile swelling, scrotal swelling and testicular pain.  Neurological:  Negative for headaches.    Updated Vital Signs BP 134/86 (BP Location: Right Arm)   Pulse 73   Temp 98.4 F (36.9 C) (Oral)   Resp (!) 22   Ht 5' 10 (1.778 m)   Wt 122.5 kg   SpO2 98%   BMI 38.74 kg/m   Physical Exam Vitals and nursing note reviewed.  Constitutional:      Comments: Patient uncomfortable appearing  HENT:     Mouth/Throat:     Mouth: Mucous membranes are moist.  Cardiovascular:     Rate and Rhythm: Normal rate and regular rhythm.     Pulses: Normal pulses.  Pulmonary:     Effort: Pulmonary effort is normal.  Abdominal:     Palpations: Abdomen is soft.     Tenderness: There is no abdominal tenderness. There is no right CVA tenderness or  left CVA tenderness.  Musculoskeletal:        General: Normal range of motion.     Right lower leg: No edema.     Left lower leg: No edema.  Skin:    General: Skin is warm.  Neurological:     General: No focal deficit present.     Mental Status: He is alert.     Sensory: No sensory deficit.     Motor: No weakness.     (all labs ordered are listed, but only abnormal results are displayed) Labs Reviewed  BASIC METABOLIC PANEL WITH GFR - Abnormal; Notable for the following components:      Result Value   CO2 21 (*)    Glucose, Bld 244 (*)    Creatinine, Ser 1.38 (*)    GFR, Estimated 59 (*)    All other  components within normal limits  CBC WITH DIFFERENTIAL/PLATELET  URINALYSIS, ROUTINE W REFLEX MICROSCOPIC    EKG: None  Radiology: CT Renal Stone Study Result Date: 04/24/2024 CLINICAL DATA:  Left flank pain. EXAM: CT ABDOMEN AND PELVIS WITHOUT CONTRAST TECHNIQUE: Multidetector CT imaging of the abdomen and pelvis was performed following the standard protocol without IV contrast. RADIATION DOSE REDUCTION: This exam was performed according to the departmental dose-optimization program which includes automated exposure control, adjustment of the mA and/or kV according to patient size and/or use of iterative reconstruction technique. COMPARISON:  Jan 27, 2023 FINDINGS: Lower chest: No acute abnormality. Hepatobiliary: No focal liver abnormality is seen. Subcentimeter gallstones are seen within the gallbladder lumen without evidence of gallbladder wall thickening or biliary dilatation. Pancreas: Unremarkable. No pancreatic ductal dilatation or surrounding inflammatory changes. Spleen: Normal in size without focal abnormality. Adrenals/Urinary Tract: Adrenal glands are unremarkable. Kidneys are normal in size, without focal lesions. A 4 mm nonobstructing renal calculus is seen within the mid right kidney. A 2 mm obstructing renal calculus is seen within the distal left ureter, with mild left-sided hydronephrosis and hydroureter. Bladder is unremarkable. Stomach/Bowel: Stomach is within normal limits. The appendix is surgically absent. No evidence of bowel wall thickening, distention, or inflammatory changes. Noninflamed diverticula are seen throughout the sigmoid colon. Vascular/Lymphatic: Aortic atherosclerosis. No enlarged abdominal or pelvic lymph nodes. Reproductive: The prostate gland is markedly enlarged. Other: No abdominal wall hernia or abnormality. No abdominopelvic ascites. Musculoskeletal: Multilevel degenerative changes are seen throughout the lumbar spine. IMPRESSION: 1. 2 mm obstructing renal  calculus within the distal left ureter. 2. 4 mm nonobstructing right renal calculus. 3. Cholelithiasis. 4. Sigmoid diverticulosis. 5. Markedly enlarged prostate gland. Correlation with PSA levels is recommended. 6. Aortic atherosclerosis. Electronically Signed   By: Suzen Dials M.D.   On: 04/24/2024 23:14     Procedures   Medications Ordered in the ED  ketorolac  (TORADOL ) 30 MG/ML injection 30 mg (30 mg Intravenous Given 04/24/24 2221)  ondansetron  (ZOFRAN ) injection 4 mg (4 mg Intravenous Given 04/24/24 2222)  HYDROmorphone  (DILAUDID ) injection 0.5 mg (0.5 mg Intravenous Given 04/24/24 2222)                                    Medical Decision Making Patient with history of recurrent kidney stones, here with sudden onset left flank plain earlier today.  Pain associated with nausea and vomiting as well.  He notes having some difficulty urinating today which he states is typical for him when he has a kidney stone.  States he has been  able to pass previous kidney stones   Clinically, I suspect kidney stone, but pyelonephritis, neoplasm, enlarged prostate also considered  Amount and/or Complexity of Data Reviewed Labs: ordered.    Details: Labs no evidence of leukocytosis, blood sugar elevated at 244, anion gap unremarkable, bicarb without significant change Radiology: ordered.    Details: CT renal stone study shows obstructing 2 mm calculus in the distal left ureter.  There is also a 4 mm nonobstructing right renal calculus, cholelithiasis and enlarged prostate seen on imaging Discussion of management or test interpretation with external provider(s): On recheck after medications, patient resting comfortably.  Nausea and vomiting have resolved.  Pain is much improved.  Workup this evening shows obstructing 2 mm stone in the distal ureter.  This is likely cause of his pain.  Urinalysis is pending.  Anticipate discharge home, discussed with overnight provider, Dr. Raford who agrees to review  urinalysis.  I have discussed importance of close outpatient follow-up as he will need further evaluation of his enlarged prostate will provide information for urology follow-up  Risk Prescription drug management.        Final diagnoses:  Ureterolithiasis    ED Discharge Orders     None          Herlinda Milling, PA-C 04/25/24 0039    Suzette Pac, MD 04/25/24 [phone]

## 2024-04-25 NOTE — Discharge Instructions (Addendum)
 Your CT this evening shows that you have a 2 mm kidney stone on the left side.  You also have a 4 mm stone on the right side but this is still in your kidney and is not the likely cause of your current pain.  You also have gallstones and an enlarged prostate seen on your CT imaging this evening.  I recommend that you follow-up with your primary care provider as you will need further evaluation of your prostate.  I have also listed a urology group that you may contact regarding your enlarged prostate and your kidney stone if you do not pass the stone in the next few days.  Return to the emergency department for any new or worsening symptoms.

## 2024-04-27 ENCOUNTER — Other Ambulatory Visit: Payer: Self-pay

## 2024-04-27 ENCOUNTER — Encounter (HOSPITAL_COMMUNITY): Payer: Self-pay | Admitting: *Deleted

## 2024-04-27 ENCOUNTER — Emergency Department (HOSPITAL_COMMUNITY)

## 2024-04-27 ENCOUNTER — Emergency Department (HOSPITAL_COMMUNITY)
Admission: EM | Admit: 2024-04-27 | Discharge: 2024-04-27 | Disposition: A | Discharge status: Home / Self Care | Attending: Emergency Medicine | Admitting: Emergency Medicine

## 2024-04-27 DIAGNOSIS — K8020 Calculus of gallbladder without cholecystitis without obstruction: Secondary | ICD-10-CM | POA: Insufficient documentation

## 2024-04-27 DIAGNOSIS — N201 Calculus of ureter: Secondary | ICD-10-CM | POA: Diagnosis not present

## 2024-04-27 DIAGNOSIS — R1011 Right upper quadrant pain: Secondary | ICD-10-CM | POA: Diagnosis present

## 2024-04-27 DIAGNOSIS — K8050 Calculus of bile duct without cholangitis or cholecystitis without obstruction: Secondary | ICD-10-CM

## 2024-04-27 LAB — CBC
HCT: 46.4 % (ref 39.0–52.0)
Hemoglobin: 16.3 g/dL (ref 13.0–17.0)
MCH: 32.7 pg (ref 26.0–34.0)
MCHC: 35.1 g/dL (ref 30.0–36.0)
MCV: 93.2 fL (ref 80.0–100.0)
Platelets: 241 K/uL (ref 150–400)
RBC: 4.98 MIL/uL (ref 4.22–5.81)
RDW: 11.8 % (ref 11.5–15.5)
WBC: 9.5 K/uL (ref 4.0–10.5)
nRBC: 0 % (ref 0.0–0.2)

## 2024-04-27 LAB — COMPREHENSIVE METABOLIC PANEL WITH GFR
ALT: 58 U/L — ABNORMAL HIGH (ref 0–44)
AST: 42 U/L — ABNORMAL HIGH (ref 15–41)
Albumin: 3.8 g/dL (ref 3.5–5.0)
Alkaline Phosphatase: 78 U/L (ref 38–126)
Anion gap: 14 (ref 5–15)
BUN: 15 mg/dL (ref 6–20)
CO2: 22 mmol/L (ref 22–32)
Calcium: 9.4 mg/dL (ref 8.9–10.3)
Chloride: 101 mmol/L (ref 98–111)
Creatinine, Ser: 1.06 mg/dL (ref 0.61–1.24)
GFR, Estimated: >60 mL/min (ref >60)
Glucose, Bld: 185 mg/dL — ABNORMAL HIGH (ref 70–99)
Potassium: 3.9 mmol/L (ref 3.5–5.1)
Sodium: 137 mmol/L (ref 135–145)
Total Bilirubin: 0.9 mg/dL (ref 0.0–1.2)
Total Protein: 7.2 g/dL (ref 6.5–8.1)

## 2024-04-27 LAB — URINALYSIS, ROUTINE W REFLEX MICROSCOPIC
Bilirubin Urine: NEGATIVE
Glucose, UA: NEGATIVE mg/dL
Ketones, ur: NEGATIVE mg/dL
Leukocytes,Ua: NEGATIVE
Nitrite: NEGATIVE
Protein, ur: NEGATIVE mg/dL
RBC / HPF: >50 RBC/hpf (ref 0–5)
Specific Gravity, Urine: 1.018 (ref 1.005–1.030)
pH: 5 (ref 5.0–8.0)

## 2024-04-27 LAB — LIPASE, BLOOD: Lipase: 41 U/L (ref 11–51)

## 2024-04-27 MED ORDER — ONDANSETRON HCL 4 MG/2ML IJ SOLN
4.0000 mg | Freq: Once | INTRAMUSCULAR | Status: AC
  Administered 2024-04-27: 4 mg via INTRAVENOUS
  Filled 2024-04-27: qty 2

## 2024-04-27 MED ORDER — DICYCLOMINE HCL 20 MG PO TABS: 20.0000 mg | ORAL_TABLET | Freq: Two times a day (BID) | ORAL | 0 refills | Status: AC

## 2024-04-27 MED ORDER — OXYCODONE-ACETAMINOPHEN 5-325 MG PO TABS
1.0000 | ORAL_TABLET | Freq: Once | ORAL | Status: AC
  Administered 2024-04-27: 1 via ORAL
  Filled 2024-04-27: qty 1

## 2024-04-27 MED ORDER — OXYCODONE-ACETAMINOPHEN 5-325 MG PO TABS: 1.0000 | ORAL_TABLET | Freq: Four times a day (QID) | ORAL | 0 refills | Status: DC | PRN

## 2024-04-27 MED ORDER — MORPHINE SULFATE (PF) 4 MG/ML IV SOLN
4.0000 mg | Freq: Once | INTRAVENOUS | Status: AC
  Administered 2024-04-27: 4 mg via INTRAVENOUS
  Filled 2024-04-27: qty 1

## 2024-04-27 MED ORDER — KETOROLAC TROMETHAMINE 15 MG/ML IJ SOLN
15.0000 mg | Freq: Once | INTRAMUSCULAR | Status: AC
  Administered 2024-04-27: 15 mg via INTRAVENOUS
  Filled 2024-04-27: qty 1

## 2024-04-27 MED ORDER — ONDANSETRON 4 MG PO TBDP
4.0000 mg | ORAL_TABLET | Freq: Once | ORAL | Status: AC
  Administered 2024-04-27: 4 mg via ORAL
  Filled 2024-04-27: qty 1

## 2024-04-27 MED ORDER — PROMETHAZINE HCL 25 MG PO TABS: 25.0000 mg | ORAL_TABLET | Freq: Four times a day (QID) | ORAL | 0 refills | Status: DC | PRN

## 2024-04-27 MED ORDER — SODIUM CHLORIDE 0.9 % IV BOLUS
1000.0000 mL | Freq: Once | INTRAVENOUS | Status: AC
  Administered 2024-04-27: 1000 mL via INTRAVENOUS

## 2024-04-27 MED ORDER — IOHEXOL 300 MG/ML  SOLN
100.0000 mL | Freq: Once | INTRAMUSCULAR | Status: AC | PRN
  Administered 2024-04-27: 100 mL via INTRAVENOUS

## 2024-04-27 NOTE — ED Provider Notes (Signed)
 [place] EMERGENCY DEPARTMENT AT [facility] Provider Note   CSN: [id number] Arrival date & time: 04/27/24  1321     Patient presents with: Abdominal Pain   Andrew Roach is a 58 y.o. male.   Patient is a 58 year old male who presents emergency department chief complaint of right upper quadrant abdominal pain which has been ongoing since this morning.  He has had associated nausea and vomiting.  He was evaluated in the emergency department 3 days ago and diagnosed with a left-sided ureteral stone.  He notes that this pain is different than that.  Gallstones were noted on his CT scan at that point.  Patient notes that he has had no associated fever or chills.  He denies any chest pain or shortness of breath.   Abdominal Pain      Prior to Admission medications   Medication Sig Start Date End Date Taking? Authorizing Provider  amoxicillin -clavulanate (AUGMENTIN ) 875-125 MG tablet Take 1 tablet by mouth 2 (two) times daily. 06/16/23   Lennie Barter, DPM  Cholecalciferol (VITAMIN D3) 50 MCG (2000 UT) TABS Take by mouth daily.    [provider]  Dulaglutide (TRULICITY) 0.75 MG/0.5ML SOPN Inject 0.75 mg into the skin once a week. Saturday or Sunday    [provider]  lisinopril (ZESTRIL) 20 MG tablet Take 20 mg by mouth daily.    [provider]  metFORMIN (GLUCOPHAGE) 1000 MG tablet Take 1,000 mg by mouth daily.    [provider]  ondansetron  (ZOFRAN ) 4 MG tablet Take 1 tablet (4 mg total) by mouth every 6 (six) hours. As needed for nausea vomiting 04/25/24   Triplett, Tammy, PA-C  oxyCODONE -acetaminophen  (PERCOCET/ROXICET) 5-325 MG tablet Take 1 tablet by mouth every 4 (four) hours as needed. 04/25/24   Triplett, Tammy, PA-C  oxyCODONE -acetaminophen  (PERCOCET/ROXICET) 5-325 MG tablet Take 1 tablet by mouth every 6 (six) hours as needed for severe pain (pain score 7-10). 04/25/24   Triplett, Tammy, PA-C  rosuvastatin (CRESTOR) 10 MG tablet  Take 10 mg by mouth daily.    [provider]  sitaGLIPtin (JANUVIA) 100 MG tablet Take 100 mg by mouth daily.    [provider]  tamsulosin  (FLOMAX ) 0.4 MG CAPS capsule Take 1 capsule (0.4 mg total) by mouth daily. 04/25/24   Triplett, Tammy, PA-C    Allergies: Shellfish allergy    Review of Systems  Gastrointestinal:  Positive for abdominal pain.  All other systems reviewed and are negative.   Updated Vital Signs BP 132/79 (BP Location: Right Arm)   Pulse 70   Temp 97.6 F (36.4 C) (Oral)   Resp (!) 23   Ht 5' 10 (1.778 m)   Wt 122.5 kg   SpO2 99%   BMI 38.75 kg/m   Physical Exam Vitals and nursing note reviewed.  Constitutional:      General: He is not in acute distress.    Appearance: Normal appearance. He is not ill-appearing.  HENT:     Head: Normocephalic and atraumatic.     Nose: Nose normal.     Mouth/Throat:     Mouth: Mucous membranes are moist.  Eyes:     Extraocular Movements: Extraocular movements intact.     Conjunctiva/sclera: Conjunctivae normal.     Pupils: Pupils are equal, round, and reactive to light.  Cardiovascular:     Rate and Rhythm: Normal rate and regular rhythm.     Pulses: Normal pulses.     Heart sounds: Normal heart sounds.  No murmur heard.    No gallop.  Pulmonary:     Effort: Pulmonary effort is normal. No respiratory distress.     Breath sounds: Normal breath sounds. No stridor. No wheezing, rhonchi or rales.  Abdominal:     General: Abdomen is flat. Bowel sounds are normal. There is no distension.     Palpations: Abdomen is soft.     Tenderness: There is abdominal tenderness in the right upper quadrant. Positive signs include Murphy's sign. Negative signs include McBurney's sign.     Hernia: No hernia is present.  Musculoskeletal:        General: Normal range of motion.     Cervical back: Normal range of motion and neck supple.  Skin:    General: Skin is warm and dry.  Neurological:     General: No focal  deficit present.     Mental Status: He is alert and oriented to person, place, and time. Mental status is at baseline.  Psychiatric:        Mood and Affect: Mood normal.        Behavior: Behavior normal.        Thought Content: Thought content normal.        Judgment: Judgment normal.     (all labs ordered are listed, but only abnormal results are displayed) Labs Reviewed  COMPREHENSIVE METABOLIC PANEL WITH GFR - Abnormal; Notable for the following components:      Result Value   Glucose, Bld 185 (*)    AST 42 (*)    ALT 58 (*)    All other components within normal limits  LIPASE, BLOOD  CBC  URINALYSIS, ROUTINE W REFLEX MICROSCOPIC    EKG: None  Radiology: No results found.   Procedures   Medications Ordered in the ED  morphine  (PF) 4 MG/ML injection 4 mg (has no administration in time range)  ondansetron  (ZOFRAN ) injection 4 mg (has no administration in time range)  ketorolac  (TORADOL ) 15 MG/ML injection 15 mg (has no administration in time range)  sodium chloride  0.9 % bolus 1,000 mL (has no administration in time range)                                    Medical Decision Making Amount and/or Complexity of Data Reviewed Labs: ordered. Radiology: ordered.  Risk Prescription drug management.   This patient presents to the ED for concern of right upper quadrant abdominal pain differential diagnosis includes acute cholecystitis, cholelithiasis, biliary colic, choledocholithiasis, cholangitis, ureteral stone, pyelonephritis    Additional history obtained:  Additional history obtained from medical records External records from outside source obtained and reviewed including medical records   Lab Tests:  I Ordered, and personally interpreted labs.  The pertinent results include: No leukocytosis, no anemia, normal kidney function, mild elevation of AST and ALT, normal bilirubin, unremarkable electrolytes, negative lipase, urinalysis with large  hemoglobin   Imaging Studies ordered:  I ordered imaging studies including CT scan abdomen pelvis, right upper quadrant ultrasound I independently visualized and interpreted imaging which showed cholelithiasis, no acute cholecystitis, left ureteral stone I agree with the radiologist interpretation   Medicines ordered and prescription drug management:  I ordered medication including oxycodone , Zofran , morphine , Toradol , Zofran , IV fluids for cholelithiasis Reevaluation of the patient after these medicines showed that the patient improved I have reviewed the patients home medicines and have made adjustments as needed   Problem List /  ED Course:  Patient is doing well at this time and is stable for discharge home.  CT scan and ultrasound findings are consistent with cholelithiasis at this point.  He has no indication for acute cholecystitis, choledocholithiasis, cholangitis at this point.  He has no changes in bilirubin and lipase is within normal limits.  He had no other acute surgical process noted on CT scan of abdomen and pelvis.  He does have a known left-sided ureteral stone and he is having no active pain with this.  Patient has been able to tolerate p.o. intake in the emergency department.  I did discuss his case with Dr. Evonnie with general surgery who did not feel the patient needed to be admitted at this point and did not feel that cholecystectomy was warranted.  She did recommend outpatient follow-up and continue pain control on an outpatient basis.  Have discussed this with the patient and he has voiced understanding.  He was provided with strict return precautions for any new or worsening symptoms.  Patient voiced understanding to the plan and had no additional questions.  Patient was evaluated by attending physician who is in agreement to plan.   Social Determinants of Health:  None        Final diagnoses:  None    ED Discharge Orders     None           Daralene Lonni JONETTA DEVONNA 04/27/24 2231    Charlyn Sora, MD 04/28/24 [phone]

## 2024-04-27 NOTE — Discharge Instructions (Addendum)
 Please follow-up closely with general surgery on an outpatient basis.  Return to emergency department immediately for any new or worsening symptoms.

## 2024-04-27 NOTE — ED Triage Notes (Signed)
 Pt with RUQ pain with N/V, denies diarrhea.   Pt states he was here for kidney stones and gallstones on 8/26.

## 2024-05-01 ENCOUNTER — Emergency Department

## 2024-05-01 ENCOUNTER — Encounter: Payer: Self-pay | Admitting: Emergency Medicine

## 2024-05-01 ENCOUNTER — Other Ambulatory Visit: Payer: Self-pay

## 2024-05-01 ENCOUNTER — Inpatient Hospital Stay
Admission: EM | Admit: 2024-05-01 | Discharge: 2024-05-03 | DRG: 417 | Disposition: A | Discharge status: Home / Self Care | Attending: Internal Medicine | Admitting: Internal Medicine

## 2024-05-01 DIAGNOSIS — K760 Fatty (change of) liver, not elsewhere classified: Secondary | ICD-10-CM | POA: Diagnosis present

## 2024-05-01 DIAGNOSIS — E119 Type 2 diabetes mellitus without complications: Secondary | ICD-10-CM

## 2024-05-01 DIAGNOSIS — E66813 Obesity, class 3: Secondary | ICD-10-CM | POA: Diagnosis present

## 2024-05-01 DIAGNOSIS — Z87442 Personal history of urinary calculi: Secondary | ICD-10-CM | POA: Diagnosis not present

## 2024-05-01 DIAGNOSIS — E785 Hyperlipidemia, unspecified: Secondary | ICD-10-CM | POA: Diagnosis present

## 2024-05-01 DIAGNOSIS — R1011 Right upper quadrant pain: Principal | ICD-10-CM

## 2024-05-01 DIAGNOSIS — E860 Dehydration: Secondary | ICD-10-CM | POA: Diagnosis present

## 2024-05-01 DIAGNOSIS — Z6839 Body mass index (BMI) 39.0-39.9, adult: Secondary | ICD-10-CM

## 2024-05-01 DIAGNOSIS — E782 Mixed hyperlipidemia: Secondary | ICD-10-CM | POA: Diagnosis present

## 2024-05-01 DIAGNOSIS — Z794 Long term (current) use of insulin: Secondary | ICD-10-CM | POA: Diagnosis not present

## 2024-05-01 DIAGNOSIS — Z7985 Long-term (current) use of injectable non-insulin antidiabetic drugs: Secondary | ICD-10-CM | POA: Diagnosis not present

## 2024-05-01 DIAGNOSIS — Z87891 Personal history of nicotine dependence: Secondary | ICD-10-CM | POA: Diagnosis not present

## 2024-05-01 DIAGNOSIS — K851 Biliary acute pancreatitis without necrosis or infection: Secondary | ICD-10-CM | POA: Diagnosis present

## 2024-05-01 DIAGNOSIS — K8010 Calculus of gallbladder with chronic cholecystitis without obstruction: Principal | ICD-10-CM | POA: Diagnosis present

## 2024-05-01 DIAGNOSIS — R160 Hepatomegaly, not elsewhere classified: Secondary | ICD-10-CM | POA: Diagnosis present

## 2024-05-01 DIAGNOSIS — E871 Hypo-osmolality and hyponatremia: Secondary | ICD-10-CM | POA: Diagnosis present

## 2024-05-01 DIAGNOSIS — N179 Acute kidney failure, unspecified: Secondary | ICD-10-CM | POA: Diagnosis present

## 2024-05-01 DIAGNOSIS — Z7984 Long term (current) use of oral hypoglycemic drugs: Secondary | ICD-10-CM | POA: Diagnosis not present

## 2024-05-01 DIAGNOSIS — Z79899 Other long term (current) drug therapy: Secondary | ICD-10-CM | POA: Diagnosis not present

## 2024-05-01 DIAGNOSIS — Z91013 Allergy to seafood: Secondary | ICD-10-CM

## 2024-05-01 DIAGNOSIS — I10 Essential (primary) hypertension: Secondary | ICD-10-CM | POA: Diagnosis present

## 2024-05-01 DIAGNOSIS — K811 Chronic cholecystitis: Secondary | ICD-10-CM | POA: Diagnosis not present

## 2024-05-01 DIAGNOSIS — E1165 Type 2 diabetes mellitus with hyperglycemia: Secondary | ICD-10-CM | POA: Diagnosis present

## 2024-05-01 LAB — LIPASE, BLOOD: Lipase: 110 U/L — ABNORMAL HIGH (ref 11–51)

## 2024-05-01 LAB — URINALYSIS, ROUTINE W REFLEX MICROSCOPIC
Bilirubin Urine: NEGATIVE
Glucose, UA: NEGATIVE mg/dL
Ketones, ur: NEGATIVE mg/dL
Leukocytes,Ua: NEGATIVE
Nitrite: NEGATIVE
Protein, ur: 30 mg/dL — AB
RBC / HPF: >50 RBC/hpf (ref 0–5)
Specific Gravity, Urine: 1.017 (ref 1.005–1.030)
pH: 5 (ref 5.0–8.0)

## 2024-05-01 LAB — CBC
HCT: 45.3 % (ref 39.0–52.0)
Hemoglobin: 15.7 g/dL (ref 13.0–17.0)
MCH: 32.4 pg (ref 26.0–34.0)
MCHC: 34.7 g/dL (ref 30.0–36.0)
MCV: 93.6 fL (ref 80.0–100.0)
Platelets: 242 K/uL (ref 150–400)
RBC: 4.84 MIL/uL (ref 4.22–5.81)
RDW: 11.7 % (ref 11.5–15.5)
WBC: 10.9 K/uL — ABNORMAL HIGH (ref 4.0–10.5)
nRBC: 0 % (ref 0.0–0.2)

## 2024-05-01 LAB — COMPREHENSIVE METABOLIC PANEL WITH GFR
ALT: 50 U/L — ABNORMAL HIGH (ref 0–44)
AST: 40 U/L (ref 15–41)
Albumin: 3.8 g/dL (ref 3.5–5.0)
Alkaline Phosphatase: 67 U/L (ref 38–126)
Anion gap: 10 (ref 5–15)
BUN: 15 mg/dL (ref 6–20)
CO2: 29 mmol/L (ref 22–32)
Calcium: 9 mg/dL (ref 8.9–10.3)
Chloride: 102 mmol/L (ref 98–111)
Creatinine, Ser: 1.35 mg/dL — ABNORMAL HIGH (ref 0.61–1.24)
GFR, Estimated: >60 mL/min (ref >60)
Glucose, Bld: 193 mg/dL — ABNORMAL HIGH (ref 70–99)
Potassium: 4.2 mmol/L (ref 3.5–5.1)
Sodium: 141 mmol/L (ref 135–145)
Total Bilirubin: 0.6 mg/dL (ref 0.0–1.2)
Total Protein: 7 g/dL (ref 6.5–8.1)

## 2024-05-01 LAB — GLUCOSE, CAPILLARY: Glucose-Capillary: 95 mg/dL (ref 70–99)

## 2024-05-01 MED ORDER — HYDROMORPHONE HCL 1 MG/ML IJ SOLN
1.0000 mg | Freq: Once | INTRAMUSCULAR | Status: AC
  Administered 2024-05-01: 1 mg via INTRAVENOUS
  Filled 2024-05-01: qty 1

## 2024-05-01 MED ORDER — LACTATED RINGERS IV SOLN: INTRAVENOUS | Status: DC

## 2024-05-01 MED ORDER — CHLORHEXIDINE GLUCONATE CLOTH 2 % EX PADS
6.0000 | MEDICATED_PAD | Freq: Every day | CUTANEOUS | Status: DC
  Administered 2024-05-02: 6 via TOPICAL

## 2024-05-01 MED ORDER — SODIUM CHLORIDE 0.9 % IV BOLUS
1000.0000 mL | Freq: Once | INTRAVENOUS | Status: AC
  Administered 2024-05-01: 1000 mL via INTRAVENOUS

## 2024-05-01 MED ORDER — ONDANSETRON HCL 4 MG/2ML IJ SOLN: 4.0000 mg | Freq: Four times a day (QID) | INTRAMUSCULAR | Status: DC | PRN

## 2024-05-01 MED ORDER — ONDANSETRON HCL 4 MG/2ML IJ SOLN
4.0000 mg | Freq: Once | INTRAMUSCULAR | Status: AC
  Administered 2024-05-01: 4 mg via INTRAVENOUS
  Filled 2024-05-01: qty 2

## 2024-05-01 MED ORDER — ONDANSETRON HCL 4 MG PO TABS: 4.0000 mg | ORAL_TABLET | Freq: Four times a day (QID) | ORAL | Status: DC | PRN

## 2024-05-01 MED ORDER — INDOCYANINE GREEN 25 MG IV SOLR
1.2500 mg | Freq: Once | INTRAVENOUS | Status: AC
  Administered 2024-05-02: 1.25 mg via INTRAVENOUS
  Filled 2024-05-01: qty 10

## 2024-05-01 MED ORDER — HYDROMORPHONE HCL 1 MG/ML IJ SOLN
1.0000 mg | INTRAMUSCULAR | Status: DC | PRN
  Administered 2024-05-02: 1 mg via INTRAVENOUS
  Filled 2024-05-01: qty 1

## 2024-05-01 MED ORDER — INSULIN ASPART 100 UNIT/ML IJ SOLN
0.0000 [IU] | Freq: Three times a day (TID) | INTRAMUSCULAR | Status: DC
  Administered 2024-05-02: 3 [IU] via SUBCUTANEOUS
  Administered 2024-05-02: 2 [IU] via SUBCUTANEOUS
  Administered 2024-05-03: 5 [IU] via SUBCUTANEOUS
  Filled 2024-05-01 (×3): qty 1

## 2024-05-01 MED ORDER — INSULIN ASPART 100 UNIT/ML IJ SOLN
0.0000 [IU] | Freq: Every day | INTRAMUSCULAR | Status: DC
  Administered 2024-05-02: 3 [IU] via SUBCUTANEOUS
  Filled 2024-05-01: qty 1

## 2024-05-01 NOTE — H&P (Addendum)
 Hey Dr. Sim are you History and Physical    Patient: Andrew Roach FMW:[id number] DOB: 12/24/1965 DOA: 05/01/2024 DOS: the patient was seen and examined on 05/01/2024 PCP: Myra Geni ORN, FNP  Patient coming from: Home  Chief Complaint:  Chief Complaint  Patient presents with   Abdominal Pain   HPI: Andrew Roach is a 58 y.o. male with medical history significant of essential hypertension, hyperlipidemia, type 2 diabetes, history of recurrent kidney stones, who presented to the ER with right-sided abdominal pain nausea vomiting.  Patient has been having the symptoms for more than a week.  He was seen in the ER on 26 and 29 for the same diagnosis.  He was diagnosed with both kidney stone and gallstones at the time.  Patient continues to be symptomatic and came back to the ER today.  In the ER today patient noted to have nausea and vomiting decreased appetite.  His lipase is 110 with mildly elevated LFTs.  Ultrasound showed evidence of gallstones with dilated common bile duct which is 7.2.  There is significantly changed from prior ultrasound from 3 days ago at that time it was 2 mm.  Surgery was consulted and GI was consulted.  Plan is to have surgery tomorrow.  Patient will be admitted to the medical service with surgical and GI consult.  Review of Systems: As mentioned in the history of present illness. All other systems reviewed and are negative. Past Medical History:  Diagnosis Date   Arthritis    Hyperlipidemia    Hypertension    Kidney stones    Type 2 diabetes mellitus (HCC)    Past Surgical History:  Procedure Laterality Date   APPENDECTOMY     ARTHRODESIS METATARSALPHALANGEAL JOINT (MTPJ) Right 06/16/2023   Procedure: ARTHRODESIS METATARSALPHALANGEAL JOINT (MTPJ) FIRST;  Surgeon: Lennie Barter, DPM;  Location: [facility];  Service: Orthopedics/Podiatry;  Laterality: Right;  Diabetic   Social History:  reports that he has never smoked. He quit smokeless tobacco use  about 16 years ago.  His smokeless tobacco use included snuff and chew. He reports current alcohol use. He reports that he does not use drugs.  Allergies  Allergen Reactions   Shellfish Allergy Anaphylaxis    History reviewed. No pertinent family history.  Prior to Admission medications   Medication Sig Start Date End Date Taking? Authorizing Provider  amoxicillin -clavulanate (AUGMENTIN ) 875-125 MG tablet Take 1 tablet by mouth 2 (two) times daily. 06/16/23   Lennie Barter, DPM  Cholecalciferol (VITAMIN D3) 50 MCG (2000 UT) TABS Take by mouth daily.    [provider]  dicyclomine  (BENTYL ) 20 MG tablet Take 1 tablet (20 mg total) by mouth 2 (two) times daily. 04/27/24   Daralene Lonni BIRCH, PA-C  Dulaglutide (TRULICITY) 0.75 MG/0.5ML SOPN Inject 0.75 mg into the skin once a week. Saturday or Sunday    [provider]  lisinopril (ZESTRIL) 20 MG tablet Take 20 mg by mouth daily.    [provider]  metFORMIN (GLUCOPHAGE) 1000 MG tablet Take 1,000 mg by mouth daily.    [provider]  ondansetron  (ZOFRAN ) 4 MG tablet Take 1 tablet (4 mg total) by mouth every 6 (six) hours. As needed for nausea vomiting 04/25/24   Triplett, Tammy, PA-C  oxyCODONE -acetaminophen  (PERCOCET/ROXICET) 5-325 MG tablet Take 1 tablet by mouth every 6 (six) hours as needed for severe pain (pain score 7-10). 04/27/24   Daralene Lonni BIRCH, PA-C  promethazine  (PHENERGAN ) 25 MG tablet Take 1 tablet (25 mg  total) by mouth every 6 (six) hours as needed for nausea or vomiting. 04/27/24   Daralene Bruckner D, PA-C  rosuvastatin (CRESTOR) 10 MG tablet Take 10 mg by mouth daily.    [provider]  sitaGLIPtin (JANUVIA) 100 MG tablet Take 100 mg by mouth daily.    [provider]  tamsulosin  (FLOMAX ) 0.4 MG CAPS capsule Take 1 capsule (0.4 mg total) by mouth daily. 04/25/24   Herlinda Milling, PA-C    Physical Exam: Vitals:   05/01/24 1023 05/01/24 1024 05/01/24 1529  BP:   117/76 119/80  Pulse:  97 (!) 53  Resp:  17 18  Temp:  98 F (36.7 C) 97.9 F (36.6 C)  TempSrc:  Oral   SpO2:  95% 97%  Weight: 124.7 kg    Height: 5' 10 (1.778 m)     Constitutional: Acutely ill looking, NAD, calm, comfortable Eyes: PERRL, lids and conjunctivae normal ENMT: Mucous membranes are dry. Posterior pharynx clear of any exudate or lesions.Normal dentition.  Neck: normal, supple, no masses, no thyromegaly Respiratory: clear to auscultation bilaterally, no wheezing, no crackles. Normal respiratory effort. No accessory muscle use.  Cardiovascular: Regular rate and rhythm, no murmurs / rubs / gallops. No extremity edema. 2+ pedal pulses. No carotid bruits.  Abdomen: Full, diffuse tenderness especially right upper quadrant, no masses palpated. No hepatosplenomegaly. Bowel sounds positive.  Musculoskeletal: Good range of motion, no joint swelling or tenderness, Skin: no rashes, lesions, ulcers. No induration Neurologic: CN 2-12 grossly intact. Sensation intact, DTR normal. Strength 5/5 in all 4.  Psychiatric: Normal judgment and insight. Alert and oriented x 3. Normal mood  Data Reviewed:  Temperature 98.3, blood pressure 120/76, pulse 52, white count 10.9, creatinine 1.35 glucose 193 lipase is 110 white count 10.9.  Analysis negative.  It was of the abdomen showed cholelithiasis without acute cholecystitis or biliary duct dilatation or choledocholithiasis.  There is mild left-sided hydronephrosis and hepatic steatosis.  Ultrasound abdomen did showed mild cholelithiasis without additional cholecystitis and diffuse increased echogenicity of hepatic parenchyma  Assessment and Plan:  #1 gallstone pancreatitis: Patient will be admitted.  Keep NPO.  Pain control.  Nausea and vomiting management.  Surgery already consulted plan for surgical intervention in the morning.  Will keep patient currently n.p.o. and continue supportive care.  #2 acute cholelithiasis: No cholecystitis.  Will  continue supportive care.  Continue to monitor  #3 essential hypertension: Patient will be NPO.  We will use IV route for blood pressure control as needed.  #4 type 2 diabetes: Initiate sliding scale insulin   #5 mixed hyperlipidemia: Will resume statin at this discharge   Advance Care Planning:   Code Status: Not on file full code  Consults: General Surgery Dr. Jordis  Family Communication: No family at bedside  Severity of Illness: The appropriate patient status for this patient is INPATIENT. Inpatient status is judged to be reasonable and necessary in order to provide the required intensity of service to ensure the patient's safety. The patient's presenting symptoms, physical exam findings, and initial radiographic and laboratory data in the context of their chronic comorbidities is felt to place them at high risk for further clinical deterioration. Furthermore, it is not anticipated that the patient will be medically stable for discharge from the hospital within 2 midnights of admission.   * I certify that at the point of admission it is my clinical judgment that the patient will require inpatient hospital care spanning beyond 2 midnights from the point of admission due  to high intensity of service, high risk for further deterioration and high frequency of surveillance required.*  AuthorBETHA SIM KNOLL, MD 05/01/2024 6:29 PM  For on call review [email or web address].

## 2024-05-01 NOTE — ED Triage Notes (Signed)
 Patient to ED via POV for right sided abd pain with vomiting. Seen on 8/26 and 8/29 for same. Dx with gallstones and kidney stones.

## 2024-05-01 NOTE — Consult Note (Signed)
 Patient ID: Andrew Roach, male   DOB: 06-02-1966, 58 y.o.   MRN: [id number]  HPI Andrew Roach is a 58 y.o. male seen in consultation at the request of Dr. Jacolyn.  Case discussed with him in detail.  He presented with 5-day history of Szo of abdominal pain that has been intermittent associated with nausea.  Patient reports that the pain is sharp located in the right upper quadrant and is moderate to severe. There is no specific alleviating or aggravating factors.  He does endorse nausea vomiting and decreased appetite. He has been to the [facility] emergency room twice. CMP shows glucose of 193 and a creatinine of 1.35 with a slight bump of the creatinine.  ALT mildly elevated to 50 and lipase of 110 that has increased as compared to 4 days ago.  White count is 10.9 normal hemoglobin. U/s personally reviewed showing evidence of gallstones more importantly CBD is  7.2 mm she says significant change when compared to prior ultrasound from 3 days ago that was 2 mm.  He did also have a CT scan that have also reviewed showing a left kidney stone. Only prior abdominal operation is appendectomy several decades ago HPI  Past Medical History:  Diagnosis Date   Arthritis    Hyperlipidemia    Hypertension    Kidney stones    Type 2 diabetes mellitus (HCC)     Past Surgical History:  Procedure Laterality Date   APPENDECTOMY     ARTHRODESIS METATARSALPHALANGEAL JOINT (MTPJ) Right 06/16/2023   Procedure: ARTHRODESIS METATARSALPHALANGEAL JOINT (MTPJ) FIRST;  Surgeon: Lennie Barter, DPM;  Location: [facility];  Service: Orthopedics/Podiatry;  Laterality: Right;  Diabetic    History reviewed. No pertinent family history.  Social History Social History   Tobacco Use   Smoking status: Never   Smokeless tobacco: Former    Types: Snuff, [place]    Quit date: 2009  Vaping Use   Vaping status: Never Used  Substance Use Topics   Alcohol use: Yes    Comment: Occasional   Drug use: Never     Allergies  Allergen Reactions   Shellfish Allergy Anaphylaxis    Current Facility-Administered Medications  Medication Dose Route Frequency Provider Last Rate Last Admin   sodium chloride  0.9 % bolus 1,000 mL  1,000 mL Intravenous Once Dymon Summerhill, Laneta FALCON, MD       Current Outpatient Medications  Medication Sig Dispense Refill   amoxicillin -clavulanate (AUGMENTIN ) 875-125 MG tablet Take 1 tablet by mouth 2 (two) times daily. 14 tablet 0   Cholecalciferol (VITAMIN D3) 50 MCG (2000 UT) TABS Take by mouth daily.     dicyclomine  (BENTYL ) 20 MG tablet Take 1 tablet (20 mg total) by mouth 2 (two) times daily. 20 tablet 0   Dulaglutide (TRULICITY) 0.75 MG/0.5ML SOPN Inject 0.75 mg into the skin once a week. Saturday or Sunday     lisinopril (ZESTRIL) 20 MG tablet Take 20 mg by mouth daily.     metFORMIN (GLUCOPHAGE) 1000 MG tablet Take 1,000 mg by mouth daily.     ondansetron  (ZOFRAN ) 4 MG tablet Take 1 tablet (4 mg total) by mouth every 6 (six) hours. As needed for nausea vomiting 12 tablet 0   oxyCODONE -acetaminophen  (PERCOCET/ROXICET) 5-325 MG tablet Take 1 tablet by mouth every 6 (six) hours as needed for severe pain (pain score 7-10). 10 tablet 0   promethazine  (PHENERGAN ) 25 MG tablet Take 1 tablet (25 mg total) by mouth every 6 (six) hours as needed  for nausea or vomiting. 15 tablet 0   rosuvastatin (CRESTOR) 10 MG tablet Take 10 mg by mouth daily.     sitaGLIPtin (JANUVIA) 100 MG tablet Take 100 mg by mouth daily.     tamsulosin  (FLOMAX ) 0.4 MG CAPS capsule Take 1 capsule (0.4 mg total) by mouth daily. 20 capsule 0     Review of Systems Full ROS  was asked and was negative except for the information on the HPI  Physical Exam Blood pressure 117/76, pulse 97, temperature 98 F (36.7 C), temperature source Oral, resp. rate 17, height 5' 10 (1.778 m), weight 124.7 kg, SpO2 95%. CONSTITUTIONAL: NAD. EYES: Pupils are equal, round, Sclera are non-icteric. EARS, NOSE, MOUTH AND THROAT:  The oropharynx is clear. The oral mucosa is pink and moist. Hearing is intact to voice. LYMPH NODES:  Lymph nodes in the neck are normal. RESPIRATORY:  Lungs are clear. There is normal respiratory effort, with equal breath sounds bilaterally, and without pathologic use of accessory muscles. CARDIOVASCULAR: Heart is regular without murmurs, gallops, or rubs. GI: The abdomen is  soft,Tender to palpation RUQ w positive Murphy sign. No peritonitis. There are no palpable masses. There is no hepatosplenomegaly. There are normal bowel sounds. GU: Rectal deferred.   MUSCULOSKELETAL: Normal muscle strength and tone. No cyanosis or edema.   SKIN: Turgor is good and there are no pathologic skin lesions or ulcers. NEUROLOGIC: Motor and sensation is grossly normal. Cranial nerves are grossly intact. PSYCH:  Oriented to person, place and time. Affect is normal.  Data Reviewed I have personally reviewed the patient's imaging, laboratory findings and medical records.    Assessment/Plan 58 year old male with gallstones that are symptomatic and potential gallstone pancreatitis that seems to be mild.  We just had fresh results from ultrasound showing an increase in the common bile duct which suggest a potential common bile duct stone.  I do recommend MRCP. If MRCP shows choledocholithiasis we will need to transfer him as we do not have an ERCP provider available this week. IF MRCP does not show stones we can admit to hospitalist and tentatively perform cholecystectomy tomorrow if lipase and clinical condition continues to improve. HE does not need urgent surgical intervention but he will benefit form cholecystectomy , timing will depend on MRCP findings and clinical condition. I Have also ordered a second bolus of fluids since he is clearly dehydrated. We did talk about cholecystectomy and what this will entail. The risks, benefits, complications, treatment options, and expected outcomes were discussed with the  patient. The possibilities of bleeding, recurrent infection, finding a normal gallbladder, perforation of viscus organs, damage to surrounding structures, bile leak, abscess formation, needing a drain placed, the need for additional procedures, reaction to medication, pulmonary aspiration,  failure to diagnose a condition, the possible need to convert to an open procedure, and creating a complication requiring transfusion or operation were discussed with the patient. The patient and/or family concurred with the proposed plan, giving informed consent.  I personally spent a total of 75 minutes in the care of the patient today including performing a medically appropriate exam/evaluation, counseling and educating, placing orders, referring and communicating with other health care professionals, documenting clinical information in the EHR, independently interpreting and reviewing images studies and coordinating care.    Laneta Luna, MD FACS General Surgeon 05/01/2024, 3:07 PM

## 2024-05-01 NOTE — ED Provider Notes (Signed)
 [facility] Provider Note    Event Date/Time   First MD Initiated Contact with Patient 05/01/24 1204     (approximate)   History   Abdominal Pain   HPI  Andrew Roach is a 58 y.o. male with a history of hypertension, diabetes, and kidney stones who presents with right upper quadrant abdominal pain over the last week, persistent course, severe intensity, associated with nausea, vomiting, inability eat.  The patient states that he initially had some pain in the left flank as well and was diagnosed with a kidney stone, but that pain has resolved.  He is only having constant pain in the right upper quadrant now.  He denies any diarrhea.  He has no fever.  I reviewed the past medical records.  The patient was seen at the [facility], ED on 8/26 with left flank pain.  CT at that time showed a 2 mm distal left ureteral stone.  He was seen there again on 8/29, now reporting right upper quadrant abdominal pain.  Ultrasound showed cholelithiasis without evidence of acute cholecystitis.  CT with contrast continues to show a left ureteral calculus and cholelithiasis.   Physical Exam   Triage Vital Signs: ED Triage Vitals  Encounter Vitals Group     BP 05/01/24 1024 117/76     Girls Systolic BP Percentile --      Girls Diastolic BP Percentile --      Boys Systolic BP Percentile --      Boys Diastolic BP Percentile --      Pulse Rate 05/01/24 1024 97     Resp 05/01/24 1024 17     Temp 05/01/24 1024 98 F (36.7 C)     Temp Source 05/01/24 1024 Oral     SpO2 05/01/24 1024 95 %     Weight 05/01/24 1023 275 lb (124.7 kg)     Height 05/01/24 1023 5' 10 (1.778 m)     Head Circumference --      Peak Flow --      Pain Score 05/01/24 1023 10     Pain Loc --      Pain Education --      Exclude from Growth Chart --     Most recent vital signs: Vitals:   05/01/24 1024  BP: 117/76  Pulse: 97  Resp: 17  Temp: 98 F (36.7 C)  SpO2: 95%     General: Alert,  uncomfortable appearing, no distress.  CV:  Good peripheral perfusion.  Resp:  Normal effort.  Abd:  Moderate right upper quadrant tenderness.  No distention.  Other:  No jaundice or scleral icterus.   ED Results / Procedures / Treatments   Labs (all labs ordered are listed, but only abnormal results are displayed) Labs Reviewed  LIPASE, BLOOD - Abnormal; Notable for the following components:      Result Value   Lipase 110 (*)    All other components within normal limits  COMPREHENSIVE METABOLIC PANEL WITH GFR - Abnormal; Notable for the following components:   Glucose, Bld 193 (*)    Creatinine, Ser 1.35 (*)    ALT 50 (*)    All other components within normal limits  CBC - Abnormal; Notable for the following components:   WBC 10.9 (*)    All other components within normal limits  URINALYSIS, ROUTINE W REFLEX MICROSCOPIC - Abnormal; Notable for the following components:   Color, Urine YELLOW (*)    APPearance HAZY (*)  Hgb urine dipstick LARGE (*)    Protein, ur 30 (*)    Bacteria, UA RARE (*)    All other components within normal limits     EKG    RADIOLOGY  US  abdomen RUQ: Pending   PROCEDURES:  Critical Care performed: No  Procedures   MEDICATIONS ORDERED IN ED: Medications  sodium chloride  0.9 % bolus 1,000 mL (has no administration in time range)  sodium chloride  0.9 % bolus 1,000 mL (0 mLs Intravenous Stopped 05/01/24 1412)  ondansetron  (ZOFRAN ) injection 4 mg (4 mg Intravenous Given 05/01/24 1234)  HYDROmorphone  (DILAUDID ) injection 1 mg (1 mg Intravenous Given 05/01/24 1233)     IMPRESSION / MDM / ASSESSMENT AND PLAN / ED COURSE  I reviewed the triage vital signs and the nursing notes.  58 year old male with PMH as noted above presents with persistent right upper quadrant abdominal pain for the last week.  He was diagnosed with a left-sided ureteral stone on previous imaging, but  the left flank pain has resolved.  He was also diagnosed with  gallstones but no evidence of cholecystitis.  Differential diagnosis includes, but is not limited to, biliary colic, acute cholecystitis, pancreatitis, choledocholithiasis, cholangitis, gastritis, PUD.  Given the recent negative CT and the relatively localized pain I do not suspect diverticulitis, colitis,, SBO, or volvulus.  We will obtain labs, repeat right upper quadrant ultrasound, and reassess.  Patient's presentation is most consistent with acute presentation with potential threat to life or bodily function.  The patient is on the cardiac monitor to evaluate for evidence of arrhythmia and/or significant heart rate changes.   ----------------------------------------- 3:06 PM on 05/01/2024 -----------------------------------------  Lab workup so far shows elevated lipase consistent with possible gallstone pancreatitis.  LFTs and bilirubin are not elevated.  CBC shows mild leukocytosis.  Ultrasound is in progress.  I consulted and discussed the case with Dr. Jordis from general surgery who agreed with the current workup.  I have signed the patient out to the oncoming ED physician Dr. Waymond.  FINAL CLINICAL IMPRESSION(S) / ED DIAGNOSES   Final diagnoses:  Right upper quadrant pain     Rx / DC Orders   ED Discharge Orders     None        Note:  This document was prepared using Dragon voice recognition software and may include unintentional dictation errors.    Jacolyn Pae, MD 05/01/24 [phone]

## 2024-05-01 NOTE — ED Provider Notes (Signed)
.-----------------------------------------   3:06 PM on 05/01/2024 -----------------------------------------  Blood pressure 117/76, pulse 97, temperature 98 F (36.7 C), temperature source Oral, resp. rate 17, height 5' 10 (1.778 m), weight 124.7 kg, SpO2 95%.  Assuming care from Dr. Jacolyn.  In short, Andrew Roach is a 58 y.o. male with a chief complaint of Abdominal Pain .  Refer to the original H&P for additional details.  The current plan of care is to follow up US , likely admit for gallstone pancreatitis.  Independent review of labs imaging are below, discussed with Dr. Harlen from surgery, admit to medicine for gallstone pancreatitis, he will likely do surgery tomorrow.  Consult to hospitalist was agreeable with plan for admission and will evaluate the patient.  He is admitted.  Clinical Course as of 05/01/24 1822  Tue May 01, 2024  1520 Discussed with Dr. Jordis from surgery about ultrasound results and dilated CBD, he recommended proceeding with MRCP, Dr. Jinny is on vacation this week.  If patient does have choledocholithiasis, he will likely need to be transferred for an ERCP prior to cholecystectomy. [TT]  1520 Independent review of labs, ALT is mildly elevated, lipase is mildly elevated, he is a mild leukocytosis, rest electrolytes not severely deranged, UA does show RBCs but not consistent with UTI, he does have known kidney stones.   [TT]  1625 US  Abdomen Limited RUQ (LIVER/GB) IMPRESSION: 1. Mild cholelithiasis without additional sonographic evidence of acute cholecystitis. 2. Diffuse increased echogenicity of the hepatic parenchyma is a nonspecific indicator of hepatocellular dysfunction, most commonly steatosis.  Of note there are 2 studies in the chart, 1 with CBD that shows a CBD of 7.2 mm another 1 that shows a CBD of 2.8 mm.  He still pending the MRCP. Reached out to radiology to figure out reason for discrepancy. [TT]  1739 MR ABDOMEN MRCP WO  CONTRAST IMPRESSION: 1. Cholelithiasis without acute cholecystitis, biliary duct dilatation, or choledocholithiasis. 2. Mild left-sided hydronephrosis, as on CT 3. Hepatic steatosis   [TT]    Clinical Course User Index [TT] [name], MD      [name], MD 05/01/24 TRENNA

## 2024-05-01 NOTE — ED Notes (Signed)
 Lab called to come and get labs

## 2024-05-02 ENCOUNTER — Inpatient Hospital Stay

## 2024-05-02 ENCOUNTER — Encounter
Admission: EM | Disposition: A | Discharge status: Home / Self Care | Payer: Self-pay | Source: Home / Self Care | Attending: Internal Medicine

## 2024-05-02 ENCOUNTER — Other Ambulatory Visit: Payer: Self-pay

## 2024-05-02 ENCOUNTER — Encounter: Payer: Self-pay | Admitting: Internal Medicine

## 2024-05-02 DIAGNOSIS — I10 Essential (primary) hypertension: Secondary | ICD-10-CM | POA: Diagnosis not present

## 2024-05-02 DIAGNOSIS — K811 Chronic cholecystitis: Secondary | ICD-10-CM

## 2024-05-02 DIAGNOSIS — K851 Biliary acute pancreatitis without necrosis or infection: Secondary | ICD-10-CM | POA: Diagnosis not present

## 2024-05-02 DIAGNOSIS — E119 Type 2 diabetes mellitus without complications: Secondary | ICD-10-CM

## 2024-05-02 LAB — CBC
HCT: 38.8 % — ABNORMAL LOW (ref 39.0–52.0)
Hemoglobin: 13.6 g/dL (ref 13.0–17.0)
MCH: 32.5 pg (ref 26.0–34.0)
MCHC: 35.1 g/dL (ref 30.0–36.0)
MCV: 92.6 fL (ref 80.0–100.0)
Platelets: 199 K/uL (ref 150–400)
RBC: 4.19 MIL/uL — ABNORMAL LOW (ref 4.22–5.81)
RDW: 11.9 % (ref 11.5–15.5)
WBC: 7.4 K/uL (ref 4.0–10.5)
nRBC: 0 % (ref 0.0–0.2)

## 2024-05-02 LAB — COMPREHENSIVE METABOLIC PANEL WITH GFR
ALT: 36 U/L (ref 0–44)
AST: 30 U/L (ref 15–41)
Albumin: 3 g/dL — ABNORMAL LOW (ref 3.5–5.0)
Alkaline Phosphatase: 53 U/L (ref 38–126)
Anion gap: 8 (ref 5–15)
BUN: 13 mg/dL (ref 6–20)
CO2: 25 mmol/L (ref 22–32)
Calcium: 8.2 mg/dL — ABNORMAL LOW (ref 8.9–10.3)
Chloride: 104 mmol/L (ref 98–111)
Creatinine, Ser: 1.03 mg/dL (ref 0.61–1.24)
GFR, Estimated: >60 mL/min (ref >60)
Glucose, Bld: 106 mg/dL — ABNORMAL HIGH (ref 70–99)
Potassium: 3.9 mmol/L (ref 3.5–5.1)
Sodium: 137 mmol/L (ref 135–145)
Total Bilirubin: 0.9 mg/dL (ref 0.0–1.2)
Total Protein: 5.7 g/dL — ABNORMAL LOW (ref 6.5–8.1)

## 2024-05-02 LAB — HIV ANTIBODY (ROUTINE TESTING W REFLEX): HIV Screen 4th Generation wRfx: NONREACTIVE

## 2024-05-02 LAB — LIPASE, BLOOD: Lipase: 37 U/L (ref 11–51)

## 2024-05-02 LAB — GLUCOSE, CAPILLARY
Glucose-Capillary: 108 mg/dL — ABNORMAL HIGH (ref 70–99)
Glucose-Capillary: 181 mg/dL — ABNORMAL HIGH (ref 70–99)
Glucose-Capillary: 191 mg/dL — ABNORMAL HIGH (ref 70–99)
Glucose-Capillary: 236 mg/dL — ABNORMAL HIGH (ref 70–99)
Glucose-Capillary: 287 mg/dL — ABNORMAL HIGH (ref 70–99)

## 2024-05-02 SURGERY — CHOLECYSTECTOMY, ROBOT-ASSISTED, LAPAROSCOPIC
Anesthesia: General

## 2024-05-02 MED ORDER — ALBUMIN HUMAN 5 % IV SOLN
INTRAVENOUS | Status: AC
  Filled 2024-05-02: qty 250

## 2024-05-02 MED ORDER — PHENYLEPHRINE 80 MCG/ML (10ML) SYRINGE FOR IV PUSH (FOR BLOOD PRESSURE SUPPORT)
PREFILLED_SYRINGE | INTRAVENOUS | Status: DC | PRN
  Administered 2024-05-02: 160 ug via INTRAVENOUS
  Administered 2024-05-02 (×2): 80 ug via INTRAVENOUS
  Administered 2024-05-02: 160 ug via INTRAVENOUS

## 2024-05-02 MED ORDER — ACETAMINOPHEN 10 MG/ML IV SOLN
INTRAVENOUS | Status: DC | PRN
  Administered 2024-05-02: 1000 mg via INTRAVENOUS

## 2024-05-02 MED ORDER — EPHEDRINE SULFATE (PRESSORS) 50 MG/ML IJ SOLN
INTRAMUSCULAR | Status: DC | PRN
Start: 2024-05-02 — End: 2024-05-02
  Administered 2024-05-02: 5 mg via INTRAVENOUS

## 2024-05-02 MED ORDER — PROPOFOL 10 MG/ML IV BOLUS
INTRAVENOUS | Status: AC
  Filled 2024-05-02: qty 40

## 2024-05-02 MED ORDER — OXYCODONE HCL 5 MG PO TABS
5.0000 mg | ORAL_TABLET | ORAL | Status: DC | PRN
  Administered 2024-05-02 (×2): 5 mg via ORAL
  Filled 2024-05-02 (×2): qty 1

## 2024-05-02 MED ORDER — BUPIVACAINE-EPINEPHRINE (PF) 0.25% -1:200000 IJ SOLN
INTRAMUSCULAR | Status: DC | PRN
  Administered 2024-05-02: 30 mL

## 2024-05-02 MED ORDER — ALBUMIN HUMAN 5 % IV SOLN
INTRAVENOUS | Status: DC | PRN
Start: 2024-05-02 — End: 2024-05-02

## 2024-05-02 MED ORDER — ROCURONIUM BROMIDE 100 MG/10ML IV SOLN
INTRAVENOUS | Status: DC | PRN
  Administered 2024-05-02: 50 mg via INTRAVENOUS
  Administered 2024-05-02: 20 mg via INTRAVENOUS

## 2024-05-02 MED ORDER — ACETAMINOPHEN 10 MG/ML IV SOLN
INTRAVENOUS | Status: AC
  Filled 2024-05-02: qty 100

## 2024-05-02 MED ORDER — KETAMINE HCL 10 MG/ML IJ SOLN
INTRAMUSCULAR | Status: DC | PRN
Start: 2024-05-02 — End: 2024-05-02
  Administered 2024-05-02 (×2): 20 mg via INTRAVENOUS

## 2024-05-02 MED ORDER — GLYCOPYRROLATE 0.2 MG/ML IJ SOLN
INTRAMUSCULAR | Status: DC | PRN
  Administered 2024-05-02: .2 mg via INTRAVENOUS

## 2024-05-02 MED ORDER — LIDOCAINE HCL (PF) 2 % IJ SOLN
INTRAMUSCULAR | Status: AC
  Filled 2024-05-02: qty 5

## 2024-05-02 MED ORDER — FENTANYL CITRATE (PF) 100 MCG/2ML IJ SOLN
INTRAMUSCULAR | Status: AC
Start: 2024-05-02 — End: 2024-05-02
  Filled 2024-05-02: qty 2

## 2024-05-02 MED ORDER — KETOROLAC TROMETHAMINE 30 MG/ML IJ SOLN
INTRAMUSCULAR | Status: DC | PRN
Start: 2024-05-02 — End: 2024-05-02
  Administered 2024-05-02: 30 mg via INTRAVENOUS

## 2024-05-02 MED ORDER — MIDAZOLAM HCL 2 MG/2ML IJ SOLN
INTRAMUSCULAR | Status: AC
  Filled 2024-05-02: qty 2

## 2024-05-02 MED ORDER — ACETAMINOPHEN 500 MG PO TABS
1000.0000 mg | ORAL_TABLET | Freq: Four times a day (QID) | ORAL | Status: DC
  Administered 2024-05-02 – 2024-05-03 (×3): 1000 mg via ORAL
  Filled 2024-05-02 (×4): qty 2

## 2024-05-02 MED ORDER — FENTANYL CITRATE (PF) 100 MCG/2ML IJ SOLN
25.0000 ug | INTRAMUSCULAR | Status: DC | PRN
  Administered 2024-05-02: 50 ug via INTRAVENOUS
  Administered 2024-05-02: 25 ug via INTRAVENOUS
  Administered 2024-05-02: 50 ug via INTRAVENOUS
  Administered 2024-05-02: 25 ug via INTRAVENOUS

## 2024-05-02 MED ORDER — DEXTROSE 5 % IV SOLN
INTRAVENOUS | Status: DC | PRN
Start: 2024-05-02 — End: 2024-05-02
  Administered 2024-05-02: 3 g via INTRAVENOUS

## 2024-05-02 MED ORDER — MIDAZOLAM HCL 2 MG/2ML IJ SOLN
INTRAMUSCULAR | Status: DC | PRN
  Administered 2024-05-02: 2 mg via INTRAVENOUS

## 2024-05-02 MED ORDER — PROPOFOL 10 MG/ML IV BOLUS
INTRAVENOUS | Status: DC | PRN
  Administered 2024-05-02: 200 mg via INTRAVENOUS

## 2024-05-02 MED ORDER — ONDANSETRON HCL 4 MG/2ML IJ SOLN
INTRAMUSCULAR | Status: DC | PRN
  Administered 2024-05-02: 4 mg via INTRAVENOUS

## 2024-05-02 MED ORDER — CEFAZOLIN SODIUM-DEXTROSE 2-4 GM/100ML-% IV SOLN
INTRAVENOUS | Status: AC
  Filled 2024-05-02: qty 100

## 2024-05-02 MED ORDER — ROCURONIUM BROMIDE 10 MG/ML (PF) SYRINGE
PREFILLED_SYRINGE | INTRAVENOUS | Status: AC
  Filled 2024-05-02: qty 10

## 2024-05-02 MED ORDER — KETAMINE HCL 50 MG/5ML IJ SOSY
PREFILLED_SYRINGE | INTRAMUSCULAR | Status: AC
  Filled 2024-05-02: qty 5

## 2024-05-02 MED ORDER — OXYCODONE HCL 5 MG PO TABS
5.0000 mg | ORAL_TABLET | Freq: Once | ORAL | Status: AC
  Administered 2024-05-02: 5 mg via ORAL

## 2024-05-02 MED ORDER — DEXAMETHASONE SODIUM PHOSPHATE 10 MG/ML IJ SOLN
INTRAMUSCULAR | Status: DC | PRN
  Administered 2024-05-02: 10 mg via INTRAVENOUS

## 2024-05-02 MED ORDER — CEFAZOLIN SODIUM-DEXTROSE 2-4 GM/100ML-% IV SOLN
2.0000 g | Freq: Three times a day (TID) | INTRAVENOUS | Status: DC
  Administered 2024-05-02 – 2024-05-03 (×2): 2 g via INTRAVENOUS
  Filled 2024-05-02 (×3): qty 100

## 2024-05-02 MED ORDER — BUPIVACAINE LIPOSOME 1.3 % IJ SUSP
INTRAMUSCULAR | Status: AC
  Filled 2024-05-02: qty 20

## 2024-05-02 MED ORDER — OXYCODONE HCL 5 MG PO TABS
ORAL_TABLET | ORAL | Status: AC
  Filled 2024-05-02: qty 1

## 2024-05-02 MED ORDER — FENTANYL CITRATE (PF) 100 MCG/2ML IJ SOLN
INTRAMUSCULAR | Status: DC | PRN
  Administered 2024-05-02: 100 ug via INTRAVENOUS

## 2024-05-02 MED ORDER — SUGAMMADEX SODIUM 500 MG/5ML IV SOLN
INTRAVENOUS | Status: DC | PRN
  Administered 2024-05-02: 400 mg via INTRAVENOUS
  Administered 2024-05-02: 100 mg via INTRAVENOUS

## 2024-05-02 MED ORDER — FENTANYL CITRATE (PF) 100 MCG/2ML IJ SOLN
INTRAMUSCULAR | Status: AC
  Filled 2024-05-02: qty 2

## 2024-05-02 MED ORDER — LIDOCAINE HCL (CARDIAC) PF 100 MG/5ML IV SOSY
PREFILLED_SYRINGE | INTRAVENOUS | Status: DC | PRN
  Administered 2024-05-02: 100 mg via INTRAVENOUS

## 2024-05-02 MED ORDER — BUPIVACAINE-EPINEPHRINE (PF) 0.25% -1:200000 IJ SOLN
INTRAMUSCULAR | Status: AC
  Filled 2024-05-02: qty 30

## 2024-05-02 MED ORDER — 0.9 % SODIUM CHLORIDE (POUR BTL) OPTIME
TOPICAL | Status: DC | PRN
  Administered 2024-05-02: 500 mL

## 2024-05-02 MED ORDER — DEXMEDETOMIDINE HCL IN NACL 80 MCG/20ML IV SOLN
INTRAVENOUS | Status: DC | PRN
Start: 2024-05-02 — End: 2024-05-02
  Administered 2024-05-02: 4 ug via INTRAVENOUS
  Administered 2024-05-02: 8 ug via INTRAVENOUS
  Administered 2024-05-02: 4 ug via INTRAVENOUS

## 2024-05-02 MED ORDER — PHENYLEPHRINE HCL-NACL 20-0.9 MG/250ML-% IV SOLN
INTRAVENOUS | Status: DC | PRN
  Administered 2024-05-02: 30 ug/min via INTRAVENOUS

## 2024-05-02 MED ORDER — CEFAZOLIN SODIUM-DEXTROSE 2-4 GM/100ML-% IV SOLN: 2.0000 g | Freq: Once | INTRAVENOUS | Status: DC

## 2024-05-02 MED ORDER — PHENYLEPHRINE HCL-NACL 20-0.9 MG/250ML-% IV SOLN
INTRAVENOUS | Status: AC
Start: 2024-05-02 — End: 2024-05-02
  Filled 2024-05-02: qty 250

## 2024-05-02 MED ORDER — DROPERIDOL 2.5 MG/ML IJ SOLN: 0.6250 mg | Freq: Once | INTRAMUSCULAR | Status: DC | PRN

## 2024-05-02 SURGICAL SUPPLY — 37 items
CANNULA REDUCER 12-8 DVNC XI (CANNULA) ×1 IMPLANT
CATH REDDICK CHOLANGI 4FR 50CM (CATHETERS) IMPLANT
CAUTERY HOOK MNPLR 1.6 DVNC XI (INSTRUMENTS) ×1 IMPLANT
CLIP LIGATING HEMO O LOK GREEN (MISCELLANEOUS) ×1 IMPLANT
DEFOGGER SCOPE WARM SEASHARP (MISCELLANEOUS) ×1 IMPLANT
DERMABOND ADVANCED .7 DNX12 (GAUZE/BANDAGES/DRESSINGS) ×1 IMPLANT
DRAPE ARM DVNC X/XI (DISPOSABLE) ×4 IMPLANT
DRAPE COLUMN DVNC XI (DISPOSABLE) ×1 IMPLANT
ELECTRODE REM PT RTRN 9FT ADLT (ELECTROSURGICAL) ×1 IMPLANT
FORCEPS BPLR R/ABLATION 8 DVNC (INSTRUMENTS) ×1 IMPLANT
FORCEPS PROGRASP DVNC XI (FORCEP) ×1 IMPLANT
GLOVE BIO SURGEON STRL SZ7 (GLOVE) ×2 IMPLANT
GOWN STRL REUS W/ TWL LRG LVL3 (GOWN DISPOSABLE) ×4 IMPLANT
IRRIGATION STRYKERFLOW (MISCELLANEOUS) IMPLANT
IV CATH ANGIO 12GX3 LT BLUE (NEEDLE) IMPLANT
KIT PINK PAD W/HEAD ARM REST (MISCELLANEOUS) ×1 IMPLANT
LABEL OR SOLS (LABEL) ×1 IMPLANT
MANIFOLD NEPTUNE II (INSTRUMENTS) ×1 IMPLANT
NDL HYPO 22X1.5 SAFETY MO (MISCELLANEOUS) ×1 IMPLANT
NEEDLE HYPO 22X1.5 SAFETY MO (MISCELLANEOUS) ×1 IMPLANT
NS IRRIG 500ML POUR BTL (IV SOLUTION) ×1 IMPLANT
OBTURATOR OPTICALSTD 8 DVNC (TROCAR) ×1 IMPLANT
PACK LAP CHOLECYSTECTOMY (MISCELLANEOUS) ×1 IMPLANT
SEAL UNIV 5-12 XI (MISCELLANEOUS) ×4 IMPLANT
SET TUBE SMOKE EVAC HIGH FLOW (TUBING) ×1 IMPLANT
SOLUTION ELECTROSURG ANTI STCK (MISCELLANEOUS) ×1 IMPLANT
SPIKE FLUID TRANSFER (MISCELLANEOUS) ×1 IMPLANT
SPONGE T-LAP 18X18 ~~LOC~~+RFID (SPONGE) ×1 IMPLANT
SPONGE T-LAP 4X18 ~~LOC~~+RFID (SPONGE) IMPLANT
STOPCOCK 3WAY MALE LL (IV SETS) IMPLANT
SUT MNCRL AB 4-0 PS2 18 (SUTURE) ×1 IMPLANT
SUT VICRYL 0 UR6 27IN ABS (SUTURE) ×2 IMPLANT
SYR 20ML LL LF (SYRINGE) IMPLANT
SYSTEM BAG RETRIEVAL 10MM (BASKET) ×1 IMPLANT
TRAP FLUID SMOKE EVACUATOR (MISCELLANEOUS) ×1 IMPLANT
WATER STERILE IRR 3000ML UROMA (IV SOLUTION) IMPLANT
WATER STERILE IRR 500ML POUR (IV SOLUTION) ×1 IMPLANT

## 2024-05-02 NOTE — Anesthesia Postprocedure Evaluation (Signed)
 Anesthesia Post Note  Patient: Andrew Roach  Procedure(s) Performed: CHOLECYSTECTOMY, ROBOT-ASSISTED, LAPAROSCOPIC  Patient location during evaluation: PACU Anesthesia Type: General Level of consciousness: awake and alert Pain management: pain level controlled Vital Signs Assessment: post-procedure vital signs reviewed and stable Respiratory status: spontaneous breathing, nonlabored ventilation and respiratory function stable Cardiovascular status: blood pressure returned to baseline and stable Postop Assessment: no apparent nausea or vomiting Anesthetic complications: no   No notable events documented.   Last Vitals:  Vitals:   05/02/24 1130 05/02/24 1200  BP: 125/80 103/60  Pulse: 76 69  Resp: 11   Temp: (!) 36.3 C   SpO2: 94% 92%    Last Pain:  Vitals:   05/02/24 1130  TempSrc:   PainSc: 6                  Fairy POUR Shealyn Sean

## 2024-05-02 NOTE — Progress Notes (Signed)
  Progress Note   Patient: Andrew Roach FMW:[id number] DOB: [birth year] DOA: 05/01/2024     1 DOS: the patient was seen and examined on 05/02/2024   Brief hospital course:  LENA FIELDHOUSE is a 58 y.o. male with medical history significant of essential hypertension, hyperlipidemia, type 2 diabetes, history of recurrent kidney stones, who presented to the ER with right-sided abdominal pain nausea vomiting.  He has a mild elevation of lipase, ultrasound shows evidence of gallstones without cholecystitis. Patient was given IV fluids, condition improving.  Seen by general surgery, had a cholecystectomy performed on 9/3.    Principal Problem:   Gallstone pancreatitis Active Problems:   Essential hypertension   Hyperlipidemia   Mixed hyperlipidemia   Type 2 diabetes mellitus without complications (HCC)   Assessment and Plan: Gallstone pancreatitis Status postcholecystectomy today, condition has improved.  Will follow patient for 1 night, probably discharge home tomorrow.  Essential hypertension. Continue home medicines.  Type 2 diabetes. Continue sliding scale insulin .      Subjective: Seen patient postop, doing well.  No significant pain  Physical Exam: Vitals:   05/02/24 1115 05/02/24 1120 05/02/24 1130 05/02/24 1200  BP: 106/64  125/80 103/60  Pulse: 82 78 76 69  Resp: 17 10 11    Temp:   (!) 97.3 F (36.3 C)   TempSrc:      SpO2: 95% 93% 94% 92%  Weight:      Height:       General exam: Appears calm and comfortable  Respiratory system: Clear to auscultation. Respiratory effort normal. Cardiovascular system: S1 & S2 heard, RRR. No JVD, murmurs, rubs, gallops or clicks. No pedal edema. Gastrointestinal system: Abdomen is nondistended, soft and nontender. No organomegaly or masses felt. Normal bowel sounds heard. Central nervous system: Alert and oriented. No focal neurological deficits. Extremities: Symmetric 5 x 5 power. Skin: No rashes, lesions or ulcers Psychiatry:  Judgement and insight appear normal. Mood & affect appropriate.    Data Reviewed:  Reviewed lab results and ultrasound results  Family Communication: None  Disposition: Status is: Inpatient Remains inpatient appropriate because: Severity of disease, postop.     Time spent: 35 minutes  Author: Murvin Mana, MD 05/02/2024 2:23 PM  For on call review [email or web address].

## 2024-05-02 NOTE — Anesthesia Procedure Notes (Signed)
 Procedure Name: Intubation Date/Time: 05/02/2024 9:12 AM  Performed by: Mathew Bernardino RAMAN, RNPre-anesthesia Checklist: Patient identified, Emergency Drugs available, Suction available and Patient being monitored Patient Re-evaluated:Patient Re-evaluated prior to induction Oxygen Delivery Method: Circle System Utilized Preoxygenation: Pre-oxygenation with 100% oxygen Induction Type: IV induction Ventilation: Mask ventilation without difficulty and Oral airway inserted - appropriate to patient size Laryngoscope Size: McGrath and 3 Grade View: Grade I Tube type: Oral Tube size: 7.5 mm Number of attempts: 2 (First attempt was DL with MAC 3. Unable to visualize cords. Grade 3 view. Immediately switched to McGrath.) Airway Equipment and Method: Stylet and Oral airway Placement Confirmation: ETT inserted through vocal cords under direct vision, positive ETCO2 and breath sounds checked- equal and bilateral Secured at: 23 cm Tube secured with: Tape Dental Injury: Teeth and Oropharynx as per pre-operative assessment  Comments: Lips, teeth, and tongue unchanged. Head and neck midline.

## 2024-05-02 NOTE — Discharge Instructions (Signed)

## 2024-05-02 NOTE — Op Note (Signed)
 Robotic assisted laparoscopic Cholecystectomy  Pre-operative Diagnosis: gallstone pancreatitis chronic cholecystitis  Post-operative Diagnosis: same  Procedure:  Robotic assisted laparoscopic Cholecystectomy  Surgeon: Laneta Luna, MD FACS  Anesthesia: Gen. with endotracheal tube  Findings: Chronic Cholecystitis  Hepatic steatosis w large liver  Estimated Blood Loss:5 cc       Specimens: Gallbladder           Complications: none   Procedure Details  The patient was seen again in the Holding Room. The benefits, complications, treatment options, and expected outcomes were discussed with the patient. The risks of bleeding, infection, recurrence of symptoms, failure to resolve symptoms, bile duct damage, bile duct leak, retained common bile duct stone, bowel injury, any of which could require further surgery and/or ERCP, stent, or papillotomy were reviewed with the patient. The likelihood of improving the patient's symptoms with return to their baseline status is good.  The patient and/or family concurred with the proposed plan, giving informed consent.  The patient was taken to Operating Room, identified  and the procedure verified as Laparoscopic Cholecystectomy.  A Time Out was held and the above information confirmed.  Prior to the induction of general anesthesia, antibiotic prophylaxis was administered. VTE prophylaxis was in place. General endotracheal anesthesia was then administered and tolerated well. After the induction, the abdomen was prepped with Chloraprep and draped in the sterile fashion. The patient was positioned in the supine position.  Cut down technique was used to enter the abdominal cavity and a Hasson trochar was placed after two vicryl stitches were anchored to the fascia. Pneumoperitoneum was then created with CO2 and tolerated well without any adverse changes in the patient's vital signs.  Three 8-mm ports were placed under direct vision. All skin incisions  were  infiltrated with a local anesthetic agent before making the incision and placing the trocars.   The patient was positioned  in reverse Trendelenburg, robot was brought to the surgical field and docked in the standard fashion.  We made sure all the instrumentation was kept indirect view at all times and that there were no collision between the arms. I scrubbed out and went to the console. Omentum was plastered to the liver initially obscuring the view of the GB. We were able to divide the omentum and dissect free from the liver and were able to identify the GB  The gallbladder was identified, the fundus grasped and retracted cephalad. Adhesions were lysed bluntly. The infundibulum was grasped and retracted laterally, exposing the peritoneum overlying the triangle of Calot. This was then divided and exposed in a blunt fashion. An extended critical view of the cystic duct and cystic artery was obtained.  The cystic duct was clearly identified and bluntly dissected.   Artery and duct were double clipped and divided. Using ICG cholangiography we visualize the cystic duct and CBD w/o evidence of bile injuries. The gallbladder was taken from the gallbladder fossa in a retrograde fashion with the electrocautery.  Hemostasis was achieved with the electrocautery. nspection of the right upper quadrant was performed. No bleeding, bile duct injury or leak, or bowel injury was noted. Robotic instruments and robotic arms were undocked in the standard fashion.  I scrubbed back in.  The gallbladder was removed and placed in an Endocatch bag.   Pneumoperitoneum was released.  The periumbilical port site was closed with interrumpted 0 Vicryl sutures. 4-0 subcuticular Monocryl was used to close the skin. Dermabond was  applied.  The patient was then extubated and brought to the  recovery room in stable condition. Sponge, lap, and needle counts were correct at closure and at the conclusion of the case.                Laneta Luna, MD, FACS

## 2024-05-02 NOTE — Progress Notes (Signed)
 Consent signed.  Pt denies any questions or concerns at this time.

## 2024-05-02 NOTE — Hospital Course (Signed)
 Andrew Roach is a 58 y.o. male with medical history significant of essential hypertension, hyperlipidemia, type 2 diabetes, history of recurrent kidney stones, who presented to the ER with right-sided abdominal pain nausea vomiting.  He has a mild elevation of lipase, ultrasound shows evidence of gallstones without cholecystitis. Patient was given IV fluids, condition improving.  Seen by general surgery, had a cholecystectomy performed on 9/3.

## 2024-05-02 NOTE — Plan of Care (Signed)

## 2024-05-02 NOTE — Transfer of Care (Signed)
 Immediate Anesthesia Transfer of Care Note  Patient: Andrew Roach  Procedure(s) Performed: CHOLECYSTECTOMY, ROBOT-ASSISTED, LAPAROSCOPIC  Patient Location: PACU  Anesthesia Type:General  Level of Consciousness: drowsy and patient cooperative  Airway & Oxygen Therapy: Patient Spontanous Breathing and Patient connected to face mask oxygen  Post-op Assessment: Report given to RN, Post -op Vital signs reviewed and stable, and Patient moving all extremities  Post vital signs: Reviewed and stable  Last Vitals:  Vitals Value Taken Time  BP 115/62 05/02/24 10:38  Temp 36.3 C 05/02/24 10:38  Pulse 76 05/02/24 10:44  Resp 14 05/02/24 10:44  SpO2 98 % 05/02/24 10:44  Vitals shown include unfiled device data.  Last Pain:  Vitals:   05/02/24 1038  TempSrc:   PainSc: Asleep         Complications: No notable events documented.

## 2024-05-02 NOTE — Anesthesia Preprocedure Evaluation (Signed)
 Anesthesia Evaluation  Patient identified by MRN, date of birth, ID band Patient awake    Reviewed: Allergy & Precautions, H&P , NPO status , Patient's Chart, lab work & pertinent test results  History of Anesthesia Complications Negative for: history of anesthetic complications  Airway Mallampati: III  TM Distance: >3 FB Neck ROM: Full   Comment: Short, thick neck Dental  (+) Dental Advidsory Given, Missing, Poor Dentition   Pulmonary neg shortness of breath, sleep apnea , neg COPD, neg recent URI   Pulmonary exam normal breath sounds clear to auscultation       Cardiovascular hypertension, (-) angina (-) Past MI and (-) Cardiac Stents Normal cardiovascular exam(-) dysrhythmias (-) Valvular Problems/Murmurs Rhythm:Regular Rate:Normal     Neuro/Psych negative neurological ROS  negative psych ROS   GI/Hepatic negative GI ROS, Neg liver ROS,,,  Endo/Other  diabetes  Class 3 obesity  Renal/GU Renal disease (kidney stones)  negative genitourinary   Musculoskeletal  (+) Arthritis ,    Abdominal   Peds negative pediatric ROS (+)  Hematology negative hematology ROS (+)   Anesthesia Other Findings Kidney stones  Hypertension Hyperlipidemia  Type 2 diabetes mellitus not well controlled   Arthritis   Wife states patient snores heavily, has sleep apnea. Not tested for sleep apnea. Long session counseling patient on risks of untreated sleep apnea.  Did not convince him to be tested, but I tried.   Reproductive/Obstetrics negative OB ROS                              Anesthesia Physical Anesthesia Plan  ASA: 3  Anesthesia Plan: General   Post-op Pain Management:    Induction: Intravenous  PONV Risk Score and Plan: 2 and Ondansetron , Dexamethasone , Midazolam  and Treatment may vary due to age or medical condition  Airway Management Planned: Oral ETT  Additional Equipment:   Intra-op  Plan:   Post-operative Plan: Extubation in OR  Informed Consent: I have reviewed the patients History and Physical, chart, labs and discussed the procedure including the risks, benefits and alternatives for the proposed anesthesia with the patient or authorized representative who has indicated his/her understanding and acceptance.     Dental Advisory Given  Plan Discussed with: Anesthesiologist, CRNA and Surgeon  Anesthesia Plan Comments: (Patient consented for risks of anesthesia including but not limited to:  - adverse reactions to medications - damage to eyes, teeth, lips or other oral mucosa - nerve damage due to positioning  - sore throat or hoarseness - Damage to heart, brain, nerves, lungs, other parts of body or loss of life  Patient voiced understanding and assent.)         Anesthesia Quick Evaluation

## 2024-05-02 NOTE — Progress Notes (Signed)
 Preoperative Review   Patient is met in the preoperative holding area. The history is reviewed in the chart and with the patient. I personally reviewed the options and rationale as well as the risks of this procedure that have been previously discussed with the patient.  Clinically improving and lipase now nml. MRCP no cbd stones.All questions asked by the patient and/or family were answered to their satisfaction.  Patient agrees to proceed with this procedure at this time.  Laneta Luna M.D. FACS

## 2024-05-03 DIAGNOSIS — I10 Essential (primary) hypertension: Secondary | ICD-10-CM | POA: Diagnosis not present

## 2024-05-03 DIAGNOSIS — K851 Biliary acute pancreatitis without necrosis or infection: Secondary | ICD-10-CM | POA: Diagnosis not present

## 2024-05-03 DIAGNOSIS — E119 Type 2 diabetes mellitus without complications: Secondary | ICD-10-CM | POA: Diagnosis not present

## 2024-05-03 LAB — BASIC METABOLIC PANEL WITH GFR
Anion gap: 8 (ref 5–15)
BUN: 15 mg/dL (ref 6–20)
CO2: 26 mmol/L (ref 22–32)
Calcium: 8.3 mg/dL — ABNORMAL LOW (ref 8.9–10.3)
Chloride: 100 mmol/L (ref 98–111)
Creatinine, Ser: 1.2 mg/dL (ref 0.61–1.24)
GFR, Estimated: >60 mL/min (ref >60)
Glucose, Bld: 244 mg/dL — ABNORMAL HIGH (ref 70–99)
Potassium: 4.2 mmol/L (ref 3.5–5.1)
Sodium: 134 mmol/L — ABNORMAL LOW (ref 135–145)

## 2024-05-03 LAB — MAGNESIUM: Magnesium: 1.9 mg/dL (ref 1.7–2.4)

## 2024-05-03 LAB — SURGICAL PATHOLOGY

## 2024-05-03 LAB — GLUCOSE, CAPILLARY: Glucose-Capillary: 268 mg/dL — ABNORMAL HIGH (ref 70–99)

## 2024-05-03 LAB — HEPATIC FUNCTION PANEL
ALT: 57 U/L — ABNORMAL HIGH (ref 0–44)
AST: 63 U/L — ABNORMAL HIGH (ref 15–41)
Albumin: 3.2 g/dL — ABNORMAL LOW (ref 3.5–5.0)
Alkaline Phosphatase: 52 U/L (ref 38–126)
Bilirubin, Direct: 0.1 mg/dL (ref 0.0–0.2)
Indirect Bilirubin: 0.6 mg/dL (ref 0.3–0.9)
Total Bilirubin: 0.7 mg/dL (ref 0.0–1.2)
Total Protein: 6.2 g/dL — ABNORMAL LOW (ref 6.5–8.1)

## 2024-05-03 LAB — CBC
HCT: 37.9 % — ABNORMAL LOW (ref 39.0–52.0)
Hemoglobin: 13.3 g/dL (ref 13.0–17.0)
MCH: 32.2 pg (ref 26.0–34.0)
MCHC: 35.1 g/dL (ref 30.0–36.0)
MCV: 91.8 fL (ref 80.0–100.0)
Platelets: 202 K/uL (ref 150–400)
RBC: 4.13 MIL/uL — ABNORMAL LOW (ref 4.22–5.81)
RDW: 11.4 % — ABNORMAL LOW (ref 11.5–15.5)
WBC: 13.7 K/uL — ABNORMAL HIGH (ref 4.0–10.5)
nRBC: 0 % (ref 0.0–0.2)

## 2024-05-03 LAB — HEMOGLOBIN A1C
Hgb A1c MFr Bld: 8.6 % — ABNORMAL HIGH (ref 4.8–5.6)
Mean Plasma Glucose: 200 mg/dL

## 2024-05-03 MED ORDER — OXYCODONE-ACETAMINOPHEN 5-325 MG PO TABS: 1.0000 | ORAL_TABLET | Freq: Four times a day (QID) | ORAL | 0 refills | Status: DC | PRN

## 2024-05-03 MED ORDER — INSULIN ASPART 100 UNIT/ML IJ SOLN: 3.0000 [IU] | Freq: Three times a day (TID) | INTRAMUSCULAR | Status: DC

## 2024-05-03 NOTE — Discharge Summary (Addendum)
 Physician Discharge Summary   Patient: Andrew Roach MRN: [id number] DOB: 1966/01/28  Admit date:     05/01/2024  Discharge date: 05/03/24  Discharge Physician: Murvin Mana   PCP: Myra Geni ORN, FNP   Recommendations at discharge:   Follow-up with PCP in 1 week. Follow-up with general surgery as scheduled.  Discharge Diagnoses: Principal Problem:   Gallstone pancreatitis Active Problems:   Essential hypertension   Hyperlipidemia   Mixed hyperlipidemia   Type 2 diabetes mellitus without complications (HCC) Mild hyponatremia Resolved Problems:   * No resolved hospital problems. * Acute kidney injury resolved  Hospital Course:  Andrew Roach is a 58 y.o. male with medical history significant of essential hypertension, hyperlipidemia, type 2 diabetes, history of recurrent kidney stones, who presented to the ER with right-sided abdominal pain nausea vomiting.  He has a mild elevation of lipase, ultrasound shows evidence of gallstones without cholecystitis. Patient was given IV fluids, condition improving.  Seen by general surgery, had a cholecystectomy performed on 9/3.   Assessment and Plan:  Gallstone pancreatitis Status post cholecystectomy.  Patient doing well, no significant abdominal pain.  Tolerating diet.  General surgery has cleared patient for discharge, will discharge.   Essential hypertension. Continue home medicines.   Type 2 diabetes. Uncontrolled with hyperglycemia. Follow-up with PCP to adjust medication.         Consultants: General surgery. Procedures performed: Cholecystectomy Disposition: Home Diet recommendation:  Discharge Diet Orders (From admission, onward)     Start     Ordered   05/03/24 0000  Diet - low sodium heart healthy        05/03/24 1009           Cardiac diet DISCHARGE MEDICATION: Allergies as of 05/03/2024       Reactions   Shellfish Allergy Anaphylaxis        Medication List     STOP taking these medications     amoxicillin -clavulanate 875-125 MG tablet Commonly known as: AUGMENTIN        TAKE these medications    dicyclomine  20 MG tablet Commonly known as: BENTYL  Take 1 tablet (20 mg total) by mouth 2 (two) times daily.   lisinopril 20 MG tablet Commonly known as: ZESTRIL Take 20 mg by mouth daily.   metFORMIN 1000 MG tablet Commonly known as: GLUCOPHAGE Take 1,000 mg by mouth daily.   ondansetron  4 MG tablet Commonly known as: ZOFRAN  Take 1 tablet (4 mg total) by mouth every 6 (six) hours. As needed for nausea vomiting   oxyCODONE -acetaminophen  5-325 MG tablet Commonly known as: PERCOCET/ROXICET Take 1 tablet by mouth every 6 (six) hours as needed for severe pain (pain score 7-10).   promethazine  25 MG tablet Commonly known as: PHENERGAN  Take 1 tablet (25 mg total) by mouth every 6 (six) hours as needed for nausea or vomiting.   rosuvastatin 10 MG tablet Commonly known as: CRESTOR Take 10 mg by mouth daily.   sitaGLIPtin 100 MG tablet Commonly known as: JANUVIA Take 100 mg by mouth daily.   tamsulosin  0.4 MG Caps capsule Commonly known as: FLOMAX  Take 1 capsule (0.4 mg total) by mouth daily.   Trulicity 0.75 MG/0.5ML Soaj Generic drug: Dulaglutide Inject 0.75 mg into the skin once a week. Saturday or Sunday   Vitamin D3 50 MCG (2000 UT) Tabs Take by mouth daily.        Follow-up Information     Jordis Laneta FALCON, MD. Go on 05/21/2024.   Specialty: General Surgery Why:  Go to appointment on 09/22 at 900 AM Contact information: [address] [place] [address] [phone]         Myra Geni ORN, FNP Follow up in 1 week(s).   Specialty: Family Medicine Contact information: [address] [place] [address] [phone]                Discharge Exam: Filed Weights   05/01/24 1023  Weight: 124.7 kg   General exam: Appears calm and comfortable  Respiratory system: Clear to auscultation. Respiratory effort  normal. Cardiovascular system: S1 & S2 heard, RRR. No JVD, murmurs, rubs, gallops or clicks. No pedal edema. Gastrointestinal system: Abdomen is nondistended, soft and nontender. No organomegaly or masses felt. Normal bowel sounds heard. Central nervous system: Alert and oriented. No focal neurological deficits. Extremities: Symmetric 5 x 5 power. Skin: No rashes, lesions or ulcers Psychiatry: Judgement and insight appear normal. Mood & affect appropriate.    Condition at discharge: good  The results of significant diagnostics from this hospitalization (including imaging, microbiology, ancillary and laboratory) are listed below for reference.   Imaging Studies: US  Abdomen Limited RUQ (LIVER/GB) Addendum Date: 05/02/2024 ADDENDUM REPORT: 05/02/2024 07:42 ADDENDUM: Correction to initial report: Common bile duct: Normal in caliber.  Diameter: 5 mm Electronically Signed   By: Aliene  Mir M.D.   On: 05/02/2024 07:42   Result Date: 05/02/2024 CLINICAL DATA:  RIGHT upper quadrant pain EXAM: ULTRASOUND ABDOMEN LIMITED RIGHT UPPER QUADRANT COMPARISON:  CT abdomen FINDINGS: Gallbladder: Gallstones: Mild cholelithiasis Sludge: None Gallbladder Wall: Within normal limits Pericholecystic fluid: None Sonographic Murphy's Sign: Unable to evaluate due to epigastric tenderness Common bile duct: Diameter: 3 mm Liver: Parenchymal echogenicity: Homogeneously increased Contours: Normal Lesions: None Portal vein: Patent.  Hepatopetal flow Other: None. IMPRESSION: 1. Mild cholelithiasis without additional sonographic evidence of acute cholecystitis. 2. Diffuse increased echogenicity of the hepatic parenchyma is a nonspecific indicator of hepatocellular dysfunction, most commonly steatosis. Electronically Signed: By: Aliene Lloyd M.D. On: 05/01/2024 16:16   MR ABDOMEN MRCP WO CONTRAST Result Date: 05/01/2024 EXAM: MRCP WITHOUT IV CONTRAST 05/01/2024 05:12:00 PM TECHNIQUE: Multisequence, multiplanar magnetic resonance  images of the abdomen without intravenous contrast. High resolution T2 images of the biliary tree and pancreatic duct were obtained with sagittal and coronal slab MIP images. 3D MRCP were created on independent workstation. IV CONTRAST: None COMPARISON: CT of 04/27/2024 and ultrasound of earlier today. CLINICAL HISTORY: Dilated CBD on ultrasound, concerns for choledocholithiasis. Abnormal ultrasound. Mild cholelithiasis without additional sonographic evidence of acute cholecystitis. Diffuse increased echogenicity of the hepatic parenchyma is a nonspecific indicator of hepatocellular dysfunction, most commonly steatosis. FINDINGS: MRCP: No choledocholithiasis. Biliary and pancreatic ducts are normal in course and caliber. LOWER CHEST: No pleural effusion. No consolidation. - LIVER: Moderate-to-marked hepatic steatosis. No focal lesion. GALLBLADDER: Multiple gallstones including it up to 7 mm. No wall thickening or pericholecystic fluid. PANCREAS: Normal size. No mass. No peripancreatic fluid. SPLEEN: Normal size. No focal lesion. ADRENAL GLANDS: Normal appearance. No mass. KIDNEYS AND URETERS: Mild left-sided hydroureteronephrosis, as on prior CT. No hydronephrosis on the right. VISUALIZED GI TRACT: Normal course and caliber. LYMPH NODES: No lymphadenopathy. VASULATURE: Normal caliber aorta. PERITONEUM: No ascites. ABDOMINAL WALL: No hernia. No mass. BONES: No acute abnormality. No osseous lesion. IMPRESSION: 1. Cholelithiasis without acute cholecystitis, biliary duct dilatation, or choledocholithiasis. 2. Mild left-sided hydronephrosis, as on CT 3. Hepatic steatosis Electronically signed by: Rockey Kilts MD 05/01/2024 05:37 PM EDT RP Workstation: [id number]   MR 3D Recon At  Scanner Result Date: 05/01/2024 EXAM: MRCP WITHOUT IV CONTRAST 05/01/2024 05:12:00 PM TECHNIQUE: Multisequence, multiplanar magnetic resonance images of the abdomen without intravenous contrast. High resolution T2 images of the biliary tree and  pancreatic duct were obtained with sagittal and coronal slab MIP images. 3D MRCP were created on independent workstation. IV CONTRAST: None COMPARISON: CT of 04/27/2024 and ultrasound of earlier today. CLINICAL HISTORY: Dilated CBD on ultrasound, concerns for choledocholithiasis. Abnormal ultrasound. Mild cholelithiasis without additional sonographic evidence of acute cholecystitis. Diffuse increased echogenicity of the hepatic parenchyma is a nonspecific indicator of hepatocellular dysfunction, most commonly steatosis. FINDINGS: MRCP: No choledocholithiasis. Biliary and pancreatic ducts are normal in course and caliber. LOWER CHEST: No pleural effusion. No consolidation. - LIVER: Moderate-to-marked hepatic steatosis. No focal lesion. GALLBLADDER: Multiple gallstones including it up to 7 mm. No wall thickening or pericholecystic fluid. PANCREAS: Normal size. No mass. No peripancreatic fluid. SPLEEN: Normal size. No focal lesion. ADRENAL GLANDS: Normal appearance. No mass. KIDNEYS AND URETERS: Mild left-sided hydroureteronephrosis, as on prior CT. No hydronephrosis on the right. VISUALIZED GI TRACT: Normal course and caliber. LYMPH NODES: No lymphadenopathy. VASULATURE: Normal caliber aorta. PERITONEUM: No ascites. ABDOMINAL WALL: No hernia. No mass. BONES: No acute abnormality. No osseous lesion. IMPRESSION: 1. Cholelithiasis without acute cholecystitis, biliary duct dilatation, or choledocholithiasis. 2. Mild left-sided hydronephrosis, as on CT 3. Hepatic steatosis Electronically signed by: Rockey Kilts MD 05/01/2024 05:37 PM EDT RP Workstation: [id number]   CT ABDOMEN PELVIS W CONTRAST Result Date: 04/27/2024 CLINICAL DATA:  Right-sided abdominal pain EXAM: CT ABDOMEN AND PELVIS WITH CONTRAST TECHNIQUE: Multidetector CT imaging of the abdomen and pelvis was performed using the standard protocol following bolus administration of intravenous contrast. RADIATION DOSE REDUCTION: This exam was performed according to  the departmental dose-optimization program which includes automated exposure control, adjustment of the mA and/or kV according to patient size and/or use of iterative reconstruction technique. CONTRAST:  OMNIPAQUE  IOHEXOL  300 MG/ML  SOLN COMPARISON:  CT abdomen and pelvis 04/25/2015. FINDINGS: Lower chest: No acute abnormality. Hepatobiliary: There is diffuse fatty infiltration of the liver. Gallstones are present. There is no biliary ductal dilatation. Pancreas: Unremarkable. No pancreatic ductal dilatation or surrounding inflammatory changes. Spleen: Normal in size without focal abnormality. Adrenals/Urinary Tract: There is a 3 mm calculus in the distal left ureter at the level of the pelvic inlet. There is mild left-sided hydroureteronephrosis. Otherwise, the adrenal glands, kidneys and bladder are within normal limits. Stomach/Bowel: Stomach is within normal limits. Appendix is not seen. No evidence of bowel wall thickening, distention, or inflammatory changes. Vascular/Lymphatic: No significant vascular findings are present. No enlarged abdominal or pelvic lymph nodes. Reproductive: The prostate gland is enlarged. Other: There small fat containing inguinal hernias. There is no ascites. Musculoskeletal: Degenerative changes affect the spine. IMPRESSION: 1. 3 mm calculus in the distal left ureter with mild obstructive uropathy. 2. Cholelithiasis. 3. Fatty infiltration of the liver. 4. Prostatomegaly. Electronically Signed   By: Greig Pique M.D.   On: 04/27/2024 20:29   US  Abdomen Limited RUQ (LIVER/GB) Result Date: 04/27/2024 CLINICAL DATA:  Acute epigastric abdominal pain. EXAM: ULTRASOUND ABDOMEN LIMITED RIGHT UPPER QUADRANT COMPARISON:  April 24, 2024.  November 27, 2022. FINDINGS: Gallbladder: Cholelithiasis is noted without gallbladder wall thickening or sonographic Murphy's sign. Common bile duct: Diameter: 2 mm which is within normal limits. Liver: No focal lesion identified. Increased  echogenicity of hepatic parenchyma is noted suggesting hepatic steatosis. Portal vein is patent on color Doppler imaging with normal direction of blood flow towards  the liver. Other: None. IMPRESSION: 1. Cholelithiasis without evidence of cholecystitis. 2. Increased echogenicity of hepatic parenchyma is noted suggesting hepatic steatosis. Electronically Signed   By: Lynwood Landy Raddle M.D.   On: 04/27/2024 16:29   CT Renal Stone Study Result Date: 04/24/2024 CLINICAL DATA:  Left flank pain. EXAM: CT ABDOMEN AND PELVIS WITHOUT CONTRAST TECHNIQUE: Multidetector CT imaging of the abdomen and pelvis was performed following the standard protocol without IV contrast. RADIATION DOSE REDUCTION: This exam was performed according to the departmental dose-optimization program which includes automated exposure control, adjustment of the mA and/or kV according to patient size and/or use of iterative reconstruction technique. COMPARISON:  Jan 27, 2023 FINDINGS: Lower chest: No acute abnormality. Hepatobiliary: No focal liver abnormality is seen. Subcentimeter gallstones are seen within the gallbladder lumen without evidence of gallbladder wall thickening or biliary dilatation. Pancreas: Unremarkable. No pancreatic ductal dilatation or surrounding inflammatory changes. Spleen: Normal in size without focal abnormality. Adrenals/Urinary Tract: Adrenal glands are unremarkable. Kidneys are normal in size, without focal lesions. A 4 mm nonobstructing renal calculus is seen within the mid right kidney. A 2 mm obstructing renal calculus is seen within the distal left ureter, with mild left-sided hydronephrosis and hydroureter. Bladder is unremarkable. Stomach/Bowel: Stomach is within normal limits. The appendix is surgically absent. No evidence of bowel wall thickening, distention, or inflammatory changes. Noninflamed diverticula are seen throughout the sigmoid colon. Vascular/Lymphatic: Aortic atherosclerosis. No enlarged abdominal or  pelvic lymph nodes. Reproductive: The prostate gland is markedly enlarged. Other: No abdominal wall hernia or abnormality. No abdominopelvic ascites. Musculoskeletal: Multilevel degenerative changes are seen throughout the lumbar spine. IMPRESSION: 1. 2 mm obstructing renal calculus within the distal left ureter. 2. 4 mm nonobstructing right renal calculus. 3. Cholelithiasis. 4. Sigmoid diverticulosis. 5. Markedly enlarged prostate gland. Correlation with PSA levels is recommended. 6. Aortic atherosclerosis. Electronically Signed   By: Suzen Dials M.D.   On: 04/24/2024 23:14    Microbiology: No results found for this or any previous visit.  Labs: CBC: Recent Labs  Lab 04/27/24 1400 05/01/24 1047 05/02/24 0405 05/03/24 0423  WBC 9.5 10.9* 7.4 13.7*  HGB 16.3 15.7 13.6 13.3  HCT 46.4 45.3 38.8* 37.9*  MCV 93.2 93.6 92.6 91.8  PLT 241 242 199 202   Basic Metabolic Panel: Recent Labs  Lab 04/27/24 1400 05/01/24 1047 05/02/24 0405 05/03/24 0423  NA 137 141 137 134*  K 3.9 4.2 3.9 4.2  CL 101 102 104 100  CO2 22 29 25 26   GLUCOSE 185* 193* 106* 244*  BUN 15 15 13 15   CREATININE 1.06 1.35* 1.03 1.20  CALCIUM 9.4 9.0 8.2* 8.3*  MG  --   --   --  1.9   Liver Function Tests: Recent Labs  Lab 04/27/24 1400 05/01/24 1047 05/02/24 0405 05/03/24 0423  AST 42* 40 30 63*  ALT 58* 50* 36 57*  ALKPHOS 78 67 53 52  BILITOT 0.9 0.6 0.9 0.7  PROT 7.2 7.0 5.7* 6.2*  ALBUMIN  3.8 3.8 3.0* 3.2*   CBG: Recent Labs  Lab 05/02/24 1046 05/02/24 1200 05/02/24 1723 05/02/24 2023 05/03/24 0800  GLUCAP 181* 191* 236* 287* 268*    Discharge time spent: 32 minutes.  Signed: Murvin Mana, MD [facility] 05/03/2024

## 2024-05-03 NOTE — Inpatient Diabetes Management (Signed)
 Inpatient Diabetes Program Recommendations  AACE/ADA: New Consensus Statement on Inpatient Glycemic Control   Target Ranges:  Prepandial:   less than 140 mg/dL      Peak postprandial:   less than 180 mg/dL (1-2 hours)      Critically ill patients:  140 - 180 mg/dL    Latest Reference Range & Units 05/02/24 07:31 05/02/24 10:46 05/02/24 12:00 05/02/24 17:23 05/02/24 20:23 05/03/24 08:00  Glucose-Capillary 70 - 99 mg/dL 891 (H) 818 (H) 808 (H) 236 (H) 287 (H) 268 (H)   Review of Glycemic Control  Diabetes history: DM2 Outpatient Diabetes medications: Trulicity 0.75mg  weekly and Metformin 1,000mg  daily  Current orders for Inpatient glycemic control: Novolog  0-9 units TID with meals and Novolog  0-5 units at bedtime.  Inpatient Diabetes Program Recommendations: Patient received Decadron  10 mg and Dextrose  50 ml on 9/3.   Insulin : Would recommend starting Novolog  3 units TID with meals (if patient eat's atleast 50% of meals).   Thanks,  Lavanda Search, RN, MSN, [facility]  Inpatient Diabetes Coordinator  Pager [phone] (8a-5p)

## 2024-05-03 NOTE — Progress Notes (Signed)
 [facility] SURGICAL PROGRESS NOTE  Hospital Day(s): 2.   Post op day(s): 1 Day Post-Op.   Interval History:  Patient seen and examined No acute events or new complaints overnight.  Patient reports he is doing well Minimal soreness at umbilicus, mostly with movements  Bump in WBC to 13.7K - this is likely reactive in nature Hgb to 13.3 Renal function normal; sCr - 1.20; UO - unmeasured Bilirubin normal at 0.7 He is on heart healthy diet  Vital signs in last 24 hours: [min-max] current  Temp:  [97.1 F (36.2 C)-98.3 F (36.8 C)] 97.4 F (36.3 C) (09/04 0404) Pulse Rate:  [49-82] 49 (09/04 0404) Resp:  [10-23] 16 (09/04 0404) BP: (103-132)/(58-80) 103/58 (09/04 0404) SpO2:  [92 %-98 %] 95 % (09/04 0404)     Height: 5' 10 (177.8 cm) Weight: 124.7 kg BMI (Calculated): 39.46   Intake/Output last 2 shifts:  09/03 0701 - 09/04 0700 In: 1870 [P.O.:420; I.V.:1050; IV Piggyback:400] Out: 10 [Blood:10]   Physical Exam:  Constitutional: alert, cooperative and no distress  Respiratory: breathing non-labored at rest  Cardiovascular: regular rate and sinus rhythm  Gastrointestinal: soft, non-tender, and non-distended Integumentary: Laparoscopic incisions are CDI with dermabond, no erythema   Labs:     Latest Ref Rng & Units 05/03/2024    4:23 AM 05/02/2024    4:05 AM 05/01/2024   10:47 AM  CBC  WBC 4.0 - 10.5 K/uL 13.7  7.4  10.9   Hemoglobin 13.0 - 17.0 g/dL 86.6  86.3  84.2   Hematocrit 39.0 - 52.0 % 37.9  38.8  45.3   Platelets 150 - 400 K/uL 202  199  242       Latest Ref Rng & Units 05/03/2024    4:23 AM 05/02/2024    4:05 AM 05/01/2024   10:47 AM  CMP  Glucose 70 - 99 mg/dL 755  893  806   BUN 6 - 20 mg/dL 15  13  15    Creatinine 0.61 - 1.24 mg/dL 8.79  8.96  8.64   Sodium 135 - 145 mmol/L 134  137  141   Potassium 3.5 - 5.1 mmol/L 4.2  3.9  4.2   Chloride 98 - 111 mmol/L 100  104  102   CO2 22 - 32 mmol/L 26  25  29    Calcium 8.9 - 10.3 mg/dL 8.3  8.2   9.0   Total Protein 6.5 - 8.1 g/dL 6.2  5.7  7.0   Total Bilirubin 0.0 - 1.2 mg/dL 0.7  0.9  0.6   Alkaline Phos 38 - 126 U/L 52  53  67   AST 15 - 41 U/L 63  30  40   ALT 0 - 44 U/L 57  36  50     Imaging studies: No new pertinent imaging studies   Assessment/Plan:  58 y.o. male 1 Day Post-Op s/p robotic assisted laparoscopic cholecystitis for gallstone pancreatitis.   - Okay to continue diet as tolerated; Reviewed dietary recommendations  - Monitor abdominal examination; on-going bowel function  - Pain control prn; antiemetics prn  - Okay to mobilize; reviewed lifting restrictions  - Further management per primary service      - Discharge Planning: Okay for discharge from surgical perspective. Follow up and instructions updated.    All of the above findings and recommendations were discussed with the patient, and the medical team, and all of patient's questions were answered to his expressed satisfaction.  -- Krina Mraz  Terryl RIGGERS [facility] 05/03/2024, 7:19 AM M-F: 7am - 4pm

## 2024-05-09 ENCOUNTER — Encounter: Payer: Self-pay | Admitting: Physician Assistant

## 2024-05-09 ENCOUNTER — Encounter: Payer: Self-pay | Admitting: Emergency Medicine

## 2024-05-09 ENCOUNTER — Ambulatory Visit (INDEPENDENT_AMBULATORY_CARE_PROVIDER_SITE_OTHER): Admitting: Physician Assistant

## 2024-05-09 ENCOUNTER — Emergency Department

## 2024-05-09 ENCOUNTER — Emergency Department
Admission: EM | Admit: 2024-05-09 | Discharge: 2024-05-09 | Disposition: A | Discharge status: Home / Self Care | Attending: Emergency Medicine | Admitting: Emergency Medicine

## 2024-05-09 ENCOUNTER — Other Ambulatory Visit: Payer: Self-pay

## 2024-05-09 VITALS — BP 135/82 | HR 66 | Ht 70.0 in | Wt 275.0 lb

## 2024-05-09 DIAGNOSIS — K851 Biliary acute pancreatitis without necrosis or infection: Secondary | ICD-10-CM

## 2024-05-09 DIAGNOSIS — R42 Dizziness and giddiness: Secondary | ICD-10-CM

## 2024-05-09 DIAGNOSIS — R1011 Right upper quadrant pain: Secondary | ICD-10-CM | POA: Diagnosis present

## 2024-05-09 DIAGNOSIS — R071 Chest pain on breathing: Secondary | ICD-10-CM

## 2024-05-09 DIAGNOSIS — J181 Lobar pneumonia, unspecified organism: Secondary | ICD-10-CM | POA: Insufficient documentation

## 2024-05-09 DIAGNOSIS — Z09 Encounter for follow-up examination after completed treatment for conditions other than malignant neoplasm: Secondary | ICD-10-CM

## 2024-05-09 DIAGNOSIS — J189 Pneumonia, unspecified organism: Secondary | ICD-10-CM

## 2024-05-09 LAB — URINALYSIS, ROUTINE W REFLEX MICROSCOPIC
Bilirubin Urine: NEGATIVE
Glucose, UA: NEGATIVE mg/dL
Hgb urine dipstick: NEGATIVE
Ketones, ur: NEGATIVE mg/dL
Leukocytes,Ua: NEGATIVE
Nitrite: NEGATIVE
Protein, ur: NEGATIVE mg/dL
Specific Gravity, Urine: >1.046 — ABNORMAL HIGH (ref 1.005–1.030)
pH: 6 (ref 5.0–8.0)

## 2024-05-09 LAB — CBC
HCT: 43.2 % (ref 39.0–52.0)
Hemoglobin: 15.5 g/dL (ref 13.0–17.0)
MCH: 33.2 pg (ref 26.0–34.0)
MCHC: 35.9 g/dL (ref 30.0–36.0)
MCV: 92.5 fL (ref 80.0–100.0)
Platelets: 275 K/uL (ref 150–400)
RBC: 4.67 MIL/uL (ref 4.22–5.81)
RDW: 12 % (ref 11.5–15.5)
WBC: 9.6 K/uL (ref 4.0–10.5)
nRBC: 0 % (ref 0.0–0.2)

## 2024-05-09 LAB — COMPREHENSIVE METABOLIC PANEL WITH GFR
ALT: 55 U/L — ABNORMAL HIGH (ref 0–44)
AST: 55 U/L — ABNORMAL HIGH (ref 15–41)
Albumin: 3.6 g/dL (ref 3.5–5.0)
Alkaline Phosphatase: 68 U/L (ref 38–126)
Anion gap: 15 (ref 5–15)
BUN: 15 mg/dL (ref 6–20)
CO2: 23 mmol/L (ref 22–32)
Calcium: 9.2 mg/dL (ref 8.9–10.3)
Chloride: 101 mmol/L (ref 98–111)
Creatinine, Ser: 0.85 mg/dL (ref 0.61–1.24)
GFR, Estimated: >60 mL/min (ref >60)
Glucose, Bld: 157 mg/dL — ABNORMAL HIGH (ref 70–99)
Potassium: 4.3 mmol/L (ref 3.5–5.1)
Sodium: 139 mmol/L (ref 135–145)
Total Bilirubin: 0.8 mg/dL (ref 0.0–1.2)
Total Protein: 6.9 g/dL (ref 6.5–8.1)

## 2024-05-09 LAB — LIPASE, BLOOD: Lipase: 35 U/L (ref 11–51)

## 2024-05-09 MED ORDER — IOHEXOL 350 MG/ML SOLN
100.0000 mL | Freq: Once | INTRAVENOUS | Status: AC | PRN
  Administered 2024-05-09: 100 mL via INTRAVENOUS

## 2024-05-09 MED ORDER — MORPHINE SULFATE (PF) 4 MG/ML IV SOLN
4.0000 mg | Freq: Once | INTRAVENOUS | Status: AC
  Administered 2024-05-09: 4 mg via INTRAVENOUS
  Filled 2024-05-09: qty 1

## 2024-05-09 MED ORDER — CEPHALEXIN 500 MG PO CAPS: 500.0000 mg | ORAL_CAPSULE | Freq: Three times a day (TID) | ORAL | 0 refills | Status: AC

## 2024-05-09 MED ORDER — SODIUM CHLORIDE 0.9 % IV SOLN: Freq: Once | INTRAVENOUS | Status: AC

## 2024-05-09 MED ORDER — AZITHROMYCIN 500 MG PO TABS
500.0000 mg | ORAL_TABLET | Freq: Once | ORAL | Status: AC
  Administered 2024-05-09: 500 mg via ORAL
  Filled 2024-05-09: qty 1

## 2024-05-09 MED ORDER — ONDANSETRON HCL 4 MG/2ML IJ SOLN
4.0000 mg | Freq: Once | INTRAMUSCULAR | Status: AC
  Administered 2024-05-09: 4 mg via INTRAVENOUS
  Filled 2024-05-09: qty 2

## 2024-05-09 MED ORDER — CEPHALEXIN 500 MG PO CAPS
500.0000 mg | ORAL_CAPSULE | Freq: Once | ORAL | Status: AC
  Administered 2024-05-09: 500 mg via ORAL
  Filled 2024-05-09: qty 1

## 2024-05-09 MED ORDER — AZITHROMYCIN 250 MG PO TABS: ORAL_TABLET | ORAL | 0 refills | Status: AC

## 2024-05-09 MED ORDER — TRAMADOL HCL 50 MG PO TABS: 50.0000 mg | ORAL_TABLET | Freq: Four times a day (QID) | ORAL | 0 refills | Status: DC | PRN

## 2024-05-09 NOTE — ED Provider Notes (Signed)
 Procedures     ----------------------------------------- 9:37 PM on 05/09/2024 -----------------------------------------   Feeling better after fluids, orthostatics normal.  Urine does show concentration consistent with dehydration.  Stable for discharge now.   Viviann Pastor, MD 05/09/24 2137

## 2024-05-09 NOTE — ED Triage Notes (Signed)
 Patient to ED via POV for dizziness and abd pain. Pt reports dizziness started yesterday. States had gallbladder surgery 1 week ago.

## 2024-05-09 NOTE — Progress Notes (Signed)
 [facility] POST-OP OFFICE VISIT  05/09/2024  HPI: Andrew Roach is a 58 y.o. male 7 days s/p robotic assisted laparoscopic cholecystitis for gallstone pancreatitis with Dr Jordis   He presents today with multiple complaints He reports feeling fine until around 1-2 days ago He first noticed what sounds like congestion in his ears stated he can hear when he swallows in his ears This has since been accompanied with dizziness especially with positional changes Also now with left lateral abdominal and chest pain especially with deep inspiration He, and his wife, report a fever to 100 at home as well No nausea, emesis, CP, bowel changes, urinary changes, nor jaundice He denied any sick contacts  Vital signs: BP 135/82   Pulse 66   Ht 5' 10 (1.778 m)   Wt 275 lb (124.7 kg)   SpO2 97%   BMI 39.46 kg/m    Physical Exam: Constitutional: NAD, wife at bedside HEENT: Bilateral ear examination limited by cerumen, membranes do not appear overtly erythematous, there is questionable fluid behind the drum Pulmonary: Lungs are CTAB, poor inspiratory effort Cardiac: Heart is RRR, no murmurs Abdomen: Soft, RUQ is non tender, majority of his tenderness is lateral left abdomen, non-distended, no rebound/guarding Skin: Laparoscopic incisions are healing well, these are ecchymotic in various healing stages, no erythema or drainage. He does NOT appear jaundiced   Assessment/Plan: This is a 58 y.o. male 7 days s/p robotic assisted laparoscopic cholecystitis for gallstone pancreatitis with Dr Jordis   - His symptoms are not very specific but I am more concerned regarding his orthostatic symptoms (ie: dizziness with positional changes) and his pain with breathing is worrisome for possible pneumonemia, especially with subjective fever. I do have a low suspicion for complication for cholecystectomy given his lack of juandice, nausea, emesis, and RUQ abdominal pain. I do think it is in his  best interest to be evaluated in the emergency department. He likely needs laboratory evaluation, CXR, and possible CT Abdomen/Pelvis. Unfortunately, I can not do this here and I do not think we should wait a few days for this to be done. He and his wife are understanding and they are in agreement to proceed right to the emergency department. We will be happy to follow results as they are available.   -- Arthea Platt, PA-C [facility] 05/09/2024, 3:02 PM M-F: 7am - 4pm

## 2024-05-09 NOTE — Patient Instructions (Signed)
Please report to the Emergency Department.

## 2024-05-09 NOTE — ED Provider Notes (Signed)
 [facility] Provider Note    Event Date/Time   First MD Initiated Contact with Patient 05/09/24 1736     (approximate)   History   Abdominal Pain   HPI  Andrew Roach is a 58 y.o. male who presents with complaints of right upper quadrant abdominal pain.  He is also complaining of cough with productive brown sputum.  Patient had cholecystectomy last week, had been doing well until a couple of days ago.  Saw his surgeon who recommended he come to the emergency department for CT scan of the abdomen and chest x-ray.  He also complains of dizziness when standing.  No dysuria     Physical Exam   Triage Vital Signs: ED Triage Vitals  Encounter Vitals Group     BP 05/09/24 1524 130/81     Girls Systolic BP Percentile --      Girls Diastolic BP Percentile --      Boys Systolic BP Percentile --      Boys Diastolic BP Percentile --      Pulse Rate 05/09/24 1524 65     Resp 05/09/24 1524 17     Temp 05/09/24 1524 97.8 F (36.6 C)     Temp Source 05/09/24 1524 Oral     SpO2 05/09/24 1524 96 %     Weight 05/09/24 1524 124 kg (273 lb 5.9 oz)     Height 05/09/24 1524 1.778 m (5' 10)     Head Circumference --      Peak Flow --      Pain Score 05/09/24 1523 6     Pain Loc --      Pain Education --      Exclude from Growth Chart --     Most recent vital signs: Vitals:   05/09/24 1524 05/09/24 2023  BP: 130/81 121/66  Pulse: 65 (!) 57  Resp: 17 19  Temp: 97.8 F (36.6 C) 97.8 F (36.6 C)  SpO2: 96% 97%     General: Awake, no distress.  CV:  Good peripheral perfusion.  Resp:  Normal effort.  Clear to auscultation bilaterally Abd:  No distention.  Mild tenderness in the right upper quadrant, no CVA tenderness Other:     ED Results / Procedures / Treatments   Labs (all labs ordered are listed, but only abnormal results are displayed) Labs Reviewed  COMPREHENSIVE METABOLIC PANEL WITH GFR - Abnormal; Notable for the following components:       Result Value   Glucose, Bld 157 (*)    AST 55 (*)    ALT 55 (*)    All other components within normal limits  LIPASE, BLOOD  CBC  URINALYSIS, ROUTINE W REFLEX MICROSCOPIC     EKG  ED ECG REPORT I, Lamar Price, the attending physician, personally viewed and interpreted this ECG.  Date: 05/09/2024  Rhythm: normal sinus rhythm QRS Axis: normal Intervals: Incomplete right bundle branch block ST/T Wave abnormalities: normal Narrative Interpretation: no evidence of acute ischemia    RADIOLOGY CT scan without acute abnormality    PROCEDURES:  Critical Care performed:   Procedures   MEDICATIONS ORDERED IN ED: Medications  morphine  (PF) 4 MG/ML injection 4 mg (4 mg Intravenous Given 05/09/24 1815)  ondansetron  (ZOFRAN ) injection 4 mg (4 mg Intravenous Given 05/09/24 1814)  iohexol  (OMNIPAQUE ) 350 MG/ML injection 100 mL (100 mLs Intravenous Contrast Given 05/09/24 1914)  0.9 %  sodium chloride  infusion ( Intravenous New Bag/Given 05/09/24 2000)  cephALEXin  (  KEFLEX ) capsule 500 mg (500 mg Oral Given 05/09/24 1958)  azithromycin  (ZITHROMAX ) tablet 500 mg (500 mg Oral Given 05/09/24 1958)     IMPRESSION / MDM / ASSESSMENT AND PLAN / ED COURSE  I reviewed the triage vital signs and the nursing notes. Patient's presentation is most consistent with acute presentation with potential threat to life or bodily function.  Patient presents with abdominal pain as detailed above, primarily in the right upper quadrant and right upper flank, not consistent with ureterolithiasis, question possible biliary leak versus pneumonia.  Lab work reviewed and is overall reassuring, no evidence of pneumonia on chest x-ray, CT scan is unremarkable as well.  Patient is feeling better after IV morphine , IV Zofran , question early pneumonia as the cause.  Anticipate likely discharge with close follow-up with his surgeon.  Will start him on antibiotics.  However patient continues to feel dizzy with  standing, IV fluids infusing.  Have asked my colleague to follow-up after fluids, check orthostatics, anticipate discharge      FINAL CLINICAL IMPRESSION(S) / ED DIAGNOSES   Final diagnoses:  Community acquired pneumonia of right lower lobe of lung     Rx / DC Orders   ED Discharge Orders          Ordered    cephALEXin  (KEFLEX ) 500 MG capsule  3 times daily        05/09/24 2019    azithromycin  (ZITHROMAX  Z-PAK) 250 MG tablet        05/09/24 2019    traMADol  (ULTRAM ) 50 MG tablet  Every 6 hours PRN        05/09/24 2019             Note:  This document was prepared using Dragon voice recognition software and may include unintentional dictation errors.   Arlander Charleston, MD 05/09/24 2028

## 2024-05-10 ENCOUNTER — Emergency Department (HOSPITAL_COMMUNITY)

## 2024-05-10 ENCOUNTER — Other Ambulatory Visit: Payer: Self-pay

## 2024-05-10 ENCOUNTER — Emergency Department (HOSPITAL_COMMUNITY)
Admission: EM | Admit: 2024-05-10 | Discharge: 2024-05-10 | Disposition: A | Discharge status: Home / Self Care | Source: Ambulatory Visit | Attending: Emergency Medicine | Admitting: Emergency Medicine

## 2024-05-10 ENCOUNTER — Encounter (HOSPITAL_COMMUNITY): Payer: Self-pay

## 2024-05-10 DIAGNOSIS — R112 Nausea with vomiting, unspecified: Secondary | ICD-10-CM | POA: Diagnosis not present

## 2024-05-10 DIAGNOSIS — I10 Essential (primary) hypertension: Secondary | ICD-10-CM | POA: Diagnosis not present

## 2024-05-10 DIAGNOSIS — R1011 Right upper quadrant pain: Secondary | ICD-10-CM | POA: Insufficient documentation

## 2024-05-10 DIAGNOSIS — R079 Chest pain, unspecified: Secondary | ICD-10-CM | POA: Diagnosis not present

## 2024-05-10 DIAGNOSIS — Z7984 Long term (current) use of oral hypoglycemic drugs: Secondary | ICD-10-CM | POA: Insufficient documentation

## 2024-05-10 DIAGNOSIS — R42 Dizziness and giddiness: Secondary | ICD-10-CM | POA: Insufficient documentation

## 2024-05-10 DIAGNOSIS — Z79899 Other long term (current) drug therapy: Secondary | ICD-10-CM | POA: Diagnosis not present

## 2024-05-10 DIAGNOSIS — E119 Type 2 diabetes mellitus without complications: Secondary | ICD-10-CM | POA: Insufficient documentation

## 2024-05-10 LAB — COMPREHENSIVE METABOLIC PANEL WITH GFR
ALT: 46 U/L — ABNORMAL HIGH (ref 0–44)
AST: 37 U/L (ref 15–41)
Albumin: 3.4 g/dL — ABNORMAL LOW (ref 3.5–5.0)
Alkaline Phosphatase: 71 U/L (ref 38–126)
Anion gap: 11 (ref 5–15)
BUN: 14 mg/dL (ref 6–20)
CO2: 23 mmol/L (ref 22–32)
Calcium: 8.7 mg/dL — ABNORMAL LOW (ref 8.9–10.3)
Chloride: 101 mmol/L (ref 98–111)
Creatinine, Ser: 0.93 mg/dL (ref 0.61–1.24)
GFR, Estimated: >60 mL/min (ref >60)
Glucose, Bld: 281 mg/dL — ABNORMAL HIGH (ref 70–99)
Potassium: 4 mmol/L (ref 3.5–5.1)
Sodium: 135 mmol/L (ref 135–145)
Total Bilirubin: 0.8 mg/dL (ref 0.0–1.2)
Total Protein: 6.7 g/dL (ref 6.5–8.1)

## 2024-05-10 LAB — CBC WITH DIFFERENTIAL/PLATELET
Abs Immature Granulocytes: 0.01 K/uL (ref 0.00–0.07)
Basophils Absolute: 0.1 K/uL (ref 0.0–0.1)
Basophils Relative: 1 %
Eosinophils Absolute: 0.2 K/uL (ref 0.0–0.5)
Eosinophils Relative: 3 %
HCT: 42.4 % (ref 39.0–52.0)
Hemoglobin: 14.8 g/dL (ref 13.0–17.0)
Immature Granulocytes: 0 %
Lymphocytes Relative: 26 %
Lymphs Abs: 1.9 K/uL (ref 0.7–4.0)
MCH: 32.6 pg (ref 26.0–34.0)
MCHC: 34.9 g/dL (ref 30.0–36.0)
MCV: 93.4 fL (ref 80.0–100.0)
Monocytes Absolute: 0.5 K/uL (ref 0.1–1.0)
Monocytes Relative: 6 %
Neutro Abs: 4.7 K/uL (ref 1.7–7.7)
Neutrophils Relative %: 64 %
Platelets: 255 K/uL (ref 150–400)
RBC: 4.54 MIL/uL (ref 4.22–5.81)
RDW: 11.9 % (ref 11.5–15.5)
WBC: 7.3 K/uL (ref 4.0–10.5)
nRBC: 0 % (ref 0.0–0.2)

## 2024-05-10 LAB — TROPONIN I (HIGH SENSITIVITY)
Troponin I (High Sensitivity): 2 ng/L (ref <18)
Troponin I (High Sensitivity): 3 ng/L (ref <18)

## 2024-05-10 LAB — BRAIN NATRIURETIC PEPTIDE: B Natriuretic Peptide: 36 pg/mL (ref 0.0–100.0)

## 2024-05-10 LAB — LIPASE, BLOOD: Lipase: 38 U/L (ref 11–51)

## 2024-05-10 MED ORDER — ONDANSETRON HCL 4 MG/2ML IJ SOLN
4.0000 mg | Freq: Once | INTRAMUSCULAR | Status: AC
  Administered 2024-05-10: 4 mg via INTRAVENOUS
  Filled 2024-05-10: qty 2

## 2024-05-10 MED ORDER — IOHEXOL 350 MG/ML SOLN
75.0000 mL | Freq: Once | INTRAVENOUS | Status: AC | PRN
  Administered 2024-05-10: 75 mL via INTRAVENOUS

## 2024-05-10 MED ORDER — SODIUM CHLORIDE 0.9 % IV BOLUS
500.0000 mL | Freq: Once | INTRAVENOUS | Status: AC
  Administered 2024-05-10: 500 mL via INTRAVENOUS

## 2024-05-10 MED ORDER — ONDANSETRON HCL 4 MG PO TABS: 4.0000 mg | ORAL_TABLET | Freq: Four times a day (QID) | ORAL | 0 refills | Status: DC

## 2024-05-10 NOTE — Discharge Instructions (Signed)
 You are seen in the emergency department for right sided chest pain, dizziness, nausea.  You had lab work EKG and a CAT scan of your chest that did not show any evidence of blood clot or pneumonia.  Your dizziness and nausea may be related to your pain medication.  Please limit this and see if this helps with your symptoms.  Drink plenty of fluids.  Follow-up with your primary care doctor and your general surgeon.  Return if any high fevers or worsening symptoms

## 2024-05-10 NOTE — ED Triage Notes (Signed)
 Pt c/o dizziness and abdominal rt side pain over a week. Pt was seen at PCP with [facility] in [place] who checked a d-dimer and stated levels were elevated and to come to the closest ED.

## 2024-05-10 NOTE — ED Provider Notes (Signed)
 [place] EMERGENCY DEPARTMENT AT [facility] Provider Note   CSN: [id number] Arrival date & time: 05/10/24  1221     Patient presents with: Dizziness   Andrew HOQUE is a 58 y.o. male.  He has a history of diabetes hypertension hyperlipidemia.  He underwent laparoscopic cholecystectomy 9/4 at [facility] by Dr. Jordis.  Since discharge he has been troubled by dizziness lightheadedness when he stands.  He also has some right sided chest pain right upper quadrant pain especially with deep breath.  Nausea and vomiting.  He was seen in the [facility] ED yesterday after seeing his primary care doctor and general surgery PA.  Had a CAT scan of his abdomen and a chest x-ray.  Treated with antibiotics for possible pneumonia.  CT abdomen showed no acute complication from surgery, stable right renal calculus, left ureteral calculus stable with mild left hydroureter.  Sigmoid diverticulosis.  He has not started antibiotics yet.  Today still felt dizzy and received a call that the primary care doctor had done a D-dimer that was elevated and he needed to come to the emergency department to evaluate for a blood clot.   The history is provided by the patient.  Dizziness Quality:  Lightheadedness Severity:  Moderate Onset quality:  Gradual Duration:  1 week Timing:  Intermittent Progression:  Unchanged Chronicity:  New Context: standing up   Relieved by:  Nothing Worsened by:  Standing up Associated symptoms: chest pain, nausea and vomiting   Associated symptoms: no headaches, no palpitations, no shortness of breath and no syncope        Prior to Admission medications   Medication Sig Start Date End Date Taking? Authorizing Provider  azithromycin  (ZITHROMAX  Z-PAK) 250 MG tablet Take 2 tablets (500 mg) on  Day 1,  followed by 1 tablet (250 mg) once daily on Days 2 through 5. 05/09/24 05/14/24  Arlander Charleston, MD  cephALEXin  (KEFLEX ) 500 MG capsule Take 1 capsule (500 mg total) by mouth 3  (three) times daily for 7 days. 05/09/24 05/16/24  Arlander Charleston, MD  Cholecalciferol (VITAMIN D3) 50 MCG (2000 UT) TABS Take by mouth daily.    [provider]  dicyclomine  (BENTYL ) 20 MG tablet Take 1 tablet (20 mg total) by mouth 2 (two) times daily. 04/27/24   Daralene Lonni BIRCH, PA-C  Dulaglutide (TRULICITY) 0.75 MG/0.5ML SOPN Inject 0.75 mg into the skin once a week. Saturday or Sunday    [provider]  lisinopril (ZESTRIL) 20 MG tablet Take 20 mg by mouth daily.    [provider]  metFORMIN (GLUCOPHAGE) 1000 MG tablet Take 1,000 mg by mouth daily.    [provider]  ondansetron  (ZOFRAN ) 4 MG tablet Take 1 tablet (4 mg total) by mouth every 6 (six) hours. As needed for nausea vomiting 04/25/24   Triplett, Tammy, PA-C  oxyCODONE -acetaminophen  (PERCOCET/ROXICET) 5-325 MG tablet Take 1 tablet by mouth every 6 (six) hours as needed for severe pain (pain score 7-10). 05/03/24   Laurita Pillion, MD  promethazine  (PHENERGAN ) 25 MG tablet Take 1 tablet (25 mg total) by mouth every 6 (six) hours as needed for nausea or vomiting. 04/27/24   Daralene Lonni D, PA-C  rosuvastatin (CRESTOR) 10 MG tablet Take 10 mg by mouth daily.    [provider]  sitaGLIPtin (JANUVIA) 100 MG tablet Take 100 mg by mouth daily.    [provider]  tamsulosin  (FLOMAX ) 0.4 MG CAPS capsule Take 1 capsule (0.4 mg total) by mouth daily. 04/25/24  Triplett, Tammy, PA-C  traMADol  (ULTRAM ) 50 MG tablet Take 1 tablet (50 mg total) by mouth every 6 (six) hours as needed. 05/09/24 05/09/25  Arlander Charleston, MD    Allergies: Shellfish allergy    Review of Systems  Constitutional:  Negative for fever.  Eyes:  Negative for visual disturbance.  Respiratory:  Negative for shortness of breath.   Cardiovascular:  Positive for chest pain. Negative for palpitations and syncope.  Gastrointestinal:  Positive for abdominal pain, nausea and vomiting.  Genitourinary:  Negative for dysuria  and hematuria.  Neurological:  Positive for dizziness. Negative for headaches.    Updated Vital Signs BP 107/71   Pulse 60   Temp 98 F (36.7 C) (Oral)   Resp 19   Ht 5' 10 (1.778 m)   Wt 124.7 kg   SpO2 94%   BMI 39.46 kg/m   Physical Exam Vitals and nursing note reviewed.  Constitutional:      Appearance: Normal appearance. He is well-developed.  HENT:     Head: Normocephalic and atraumatic.  Eyes:     Conjunctiva/sclera: Conjunctivae normal.  Cardiovascular:     Rate and Rhythm: Normal rate and regular rhythm.     Heart sounds: No murmur heard. Pulmonary:     Effort: Pulmonary effort is normal. No respiratory distress.     Breath sounds: Normal breath sounds.  Abdominal:     Palpations: Abdomen is soft.     Tenderness: There is abdominal tenderness (ruq). There is no guarding or rebound.  Musculoskeletal:        General: No deformity.     Cervical back: Neck supple.  Skin:    General: Skin is warm and dry.  Neurological:     General: No focal deficit present.     Mental Status: He is alert.     GCS: GCS eye subscore is 4. GCS verbal subscore is 5. GCS motor subscore is 6.     Sensory: No sensory deficit.     Motor: No weakness.     (all labs ordered are listed, but only abnormal results are displayed) Labs Reviewed  COMPREHENSIVE METABOLIC PANEL WITH GFR - Abnormal; Notable for the following components:      Result Value   Glucose, Bld 281 (*)    Calcium 8.7 (*)    Albumin  3.4 (*)    ALT 46 (*)    All other components within normal limits  LIPASE, BLOOD  BRAIN NATRIURETIC PEPTIDE  CBC WITH DIFFERENTIAL/PLATELET  TROPONIN I (HIGH SENSITIVITY)  TROPONIN I (HIGH SENSITIVITY)    EKG: None  Radiology: CT Angio Chest PE W/Cm &/Or Wo Cm Result Date: 05/10/2024 CLINICAL DATA:  Pulmonary embolism (PE) suspected, low to intermediate prob, positive D-dimer EXAM: CT ANGIOGRAPHY CHEST WITH CONTRAST TECHNIQUE: Multidetector CT imaging of the chest was  performed using the standard protocol during bolus administration of intravenous contrast. Multiplanar CT image reconstructions and MIPs were obtained to evaluate the vascular anatomy. RADIATION DOSE REDUCTION: This exam was performed according to the departmental dose-optimization program which includes automated exposure control, adjustment of the mA and/or kV according to patient size and/or use of iterative reconstruction technique. CONTRAST:  75mL OMNIPAQUE  IOHEXOL  350 MG/ML SOLN COMPARISON:  05/09/2024 FINDINGS: Pulmonary Embolism: No pulmonary embolism. Cardiovascular: No cardiomegaly or pericardial effusion.Chunky calcified plaque at the ostium of the LAD. Left coronary atherosclerosis also present.No aortic aneurysm. Mediastinum/Nodes: No mediastinal mass.No mediastinal, hilar, or axillary lymphadenopathy. Lungs/Pleura: The midline trachea and bronchi are patent. No focal airspace consolidation,  pleural effusion, or pneumothorax. Posterior bibasilar dependent atelectasis. Musculoskeletal: No acute fracture or destructive bone lesion. Multilevel degenerative disc disease of the spine. Thoracic DISH. Upper Abdomen: No acute abnormality in the partially visualized upper abdomen. Minimal post surgical stranding in the gallbladder fossa, an expected finding at this stage in the post operative recovery. Review of the MIP images confirms the above findings. IMPRESSION: No acute intrathoracic abnormality; specifically, no pulmonary embolism, pneumonia, or pleural effusion. Electronically Signed   By: Rogelia Myers M.D.   On: 05/10/2024 14:56   CT ABDOMEN PELVIS W CONTRAST Result Date: 05/09/2024 CLINICAL DATA:  Dizziness, abdominal pain, cholecystectomy 1 week ago EXAM: CT ABDOMEN AND PELVIS WITH CONTRAST TECHNIQUE: Multidetector CT imaging of the abdomen and pelvis was performed using the standard protocol following bolus administration of intravenous contrast. RADIATION DOSE REDUCTION: This exam was  performed according to the departmental dose-optimization program which includes automated exposure control, adjustment of the mA and/or kV according to patient size and/or use of iterative reconstruction technique. CONTRAST:  OMNIPAQUE  IOHEXOL  350 MG/ML SOLN COMPARISON:  04/27/2024 FINDINGS: Lower chest: No acute pleural or parenchymal lung disease. Hepatobiliary: Interval cholecystectomy. Minimal fat stranding and fluid at the gallbladder fossa compatible postoperative change. No evidence of fluid collection or abscess. Decreased liver attenuation consistent with hepatic steatosis. No focal liver abnormality or biliary duct dilation. Pancreas: Unremarkable. No pancreatic ductal dilatation or surrounding inflammatory changes. Spleen: Normal in size without focal abnormality. Adrenals/Urinary Tract: Stable nonobstructing 5 mm calculus within the right renal pelvis. Stable 5 mm distal left ureteral calculus, with mild left hydroureter. No left-sided hydronephrosis. Normal enhancement of the renal parenchyma. The adrenals and bladder are unremarkable. Stomach/Bowel: No bowel obstruction or ileus. Scattered sigmoid diverticulosis without diverticulitis. No bowel wall thickening or inflammatory change. Vascular/Lymphatic: No significant vascular findings are present. No enlarged abdominal or pelvic lymph nodes. Reproductive: Stable enlargement of the prostate. Other: No free fluid or free intraperitoneal gas. No abdominal wall hernia. Musculoskeletal: Areas of fat stranding within the anterior abdominal wall and umbilicus consistent with recent laparoscopic procedure. No acute or destructive bony abnormalities. Reconstructed images demonstrate no additional findings. IMPRESSION: 1. Interval laparoscopic cholecystectomy, without evidence of acute complication. 2. Stable 5 mm nonobstructing right renal calculus. 3. Stable 5 mm distal left ureteral calculus, with stable mild left hydroureter. No evidence of left  hydronephrosis. 4. Sigmoid diverticulosis without diverticulitis. 5. Hepatic steatosis. 6. Enlarged prostate. Electronically Signed   By: Ozell Daring M.D.   On: 05/09/2024 19:31   DG Chest Port 1 View Result Date: 05/09/2024 CLINICAL DATA:  Status post gallbladder surgery 1 week ago, presenting with dizziness and abdominal pain. EXAM: PORTABLE CHEST 1 VIEW COMPARISON:  July 12, 2010 FINDINGS: The heart size and mediastinal contours are within normal limits. Both lungs are clear. The visualized skeletal structures are unremarkable. IMPRESSION: No active disease. Electronically Signed   By: Suzen Dials M.D.   On: 05/09/2024 18:23     Procedures   Medications Ordered in the ED  sodium chloride  0.9 % bolus 500 mL (0 mLs Intravenous Stopped 05/10/24 1601)  ondansetron  (ZOFRAN ) injection 4 mg (4 mg Intravenous Given 05/10/24 1321)  iohexol  (OMNIPAQUE ) 350 MG/ML injection 75 mL (75 mLs Intravenous Contrast Given 05/10/24 1424)    Clinical Course as of 05/10/24 1745  Thu May 10, 2024  1339 EKG not crossing into epic.  Sinus rhythm incomplete right bundle branch block no acute ST-T's. [MB]    Clinical Course User Index [MB] Kyann Heydt C,  MD                                 Medical Decision Making Amount and/or Complexity of Data Reviewed Labs: ordered. Radiology: ordered.  Risk Prescription drug management.   This patient complains of dizziness, nausea vomiting, right sided chest pain; this involves an extensive number of treatment Options and is a complaint that carries with it a high risk of complications and morbidity. The differential includes postoperative pain, biliary colic, abscess, PE, pneumonia, medication side effect  I ordered, reviewed and interpreted labs, which included CBC normal, chemistries elevated glucose mild elevation of ALT, BNP normal, troponins flat I ordered medication IV fluids and nausea medicine and reviewed PMP when indicated. I ordered  imaging studies which included CT angio chest and I independently    visualized and interpreted imaging which showed no acute findings, no evidence of PE Additional history obtained from patient's wife Previous records obtained and reviewed in epic including recent operative note and prior ED visit Cardiac monitoring reviewed, sinus rhythm Social determinants considered, financial insecurity Critical Interventions: None  After the interventions stated above, I reevaluated the patient and found patient to have stable vitals and in no distress Admission and further testing considered, no indications for admission at this time.  Recommending stopping his oxycodone  to see if that may be contributing to his dizziness and nausea.  Close follow-up with PCP and general surgery team.  Return instructions discussed      Final diagnoses:  Right-sided chest pain  Dizziness    ED Discharge Orders          Ordered    ondansetron  (ZOFRAN ) 4 MG tablet  Every 6 hours        05/10/24 1534               Towana Ozell BROCKS, MD 05/10/24 1747

## 2024-05-21 ENCOUNTER — Encounter: Payer: Self-pay | Admitting: Surgery

## 2024-05-21 ENCOUNTER — Ambulatory Visit (INDEPENDENT_AMBULATORY_CARE_PROVIDER_SITE_OTHER): Admitting: Surgery

## 2024-05-21 VITALS — BP 127/75 | HR 62 | Temp 98.0°F | Ht 70.0 in | Wt 277.2 lb

## 2024-05-21 DIAGNOSIS — K851 Biliary acute pancreatitis without necrosis or infection: Secondary | ICD-10-CM

## 2024-05-21 DIAGNOSIS — Z09 Encounter for follow-up examination after completed treatment for conditions other than malignant neoplasm: Secondary | ICD-10-CM

## 2024-05-21 NOTE — Patient Instructions (Signed)

## 2024-05-21 NOTE — Progress Notes (Signed)
 Outpatient Surgical Follow Up  05/21/2024  Andrew Roach is an 58 y.o. male.   Chief Complaint  Patient presents with   Routine Post Op    Lap choley 05/02/24    HPI: s/p rob chole, had dehydration and  dizziness. CT scan recently done pers reviewed, no evidence of ocmplicaitons related to recent surgery, taking po  Past Medical History:  Diagnosis Date   Arthritis    Hyperlipidemia    Hypertension    Kidney stones    Type 2 diabetes mellitus (HCC)     Past Surgical History:  Procedure Laterality Date   APPENDECTOMY     ARTHRODESIS METATARSALPHALANGEAL JOINT (MTPJ) Right 06/16/2023   Procedure: ARTHRODESIS METATARSALPHALANGEAL JOINT (MTPJ) FIRST;  Surgeon: Lennie Barter, DPM;  Location: [facility];  Service: Orthopedics/Podiatry;  Laterality: Right;  Diabetic   CHOLECYSTECTOMY      History reviewed. No pertinent family history.  Social History:  reports that he has never smoked. He quit smokeless tobacco use about 16 years ago.  His smokeless tobacco use included snuff and chew. He reports current alcohol use. He reports that he does not use drugs.  Allergies:  Allergies  Allergen Reactions   Shellfish Allergy Anaphylaxis    Medications reviewed.    ROS Full ROS performed and is otherwise negative other than what is stated in HPI   BP 127/75   Pulse 62   Temp 98 F (36.7 C) (Oral)   Ht 5' 10 (1.778 m)   Wt 277 lb 3.2 oz (125.7 kg)   SpO2 97%   BMI 39.77 kg/m   Physical Exam  NAD alert Abd: soft, nt, incision cd/I, no infection or peritonitis  Assessment/Plan:s/p robotic chole, doing well w/o complications RTC prn  Laneta Luna, MD FACS General Surgeon

## 2024-06-01 ENCOUNTER — Other Ambulatory Visit: Payer: Self-pay

## 2024-06-01 ENCOUNTER — Emergency Department (HOSPITAL_COMMUNITY)
Admission: EM | Admit: 2024-06-01 | Discharge: 2024-06-01 | Disposition: A | Discharge status: Home / Self Care | Attending: Emergency Medicine | Admitting: Emergency Medicine

## 2024-06-01 ENCOUNTER — Emergency Department (HOSPITAL_COMMUNITY)

## 2024-06-01 DIAGNOSIS — N132 Hydronephrosis with renal and ureteral calculous obstruction: Secondary | ICD-10-CM | POA: Diagnosis not present

## 2024-06-01 DIAGNOSIS — R10A1 Flank pain, right side: Secondary | ICD-10-CM | POA: Diagnosis present

## 2024-06-01 LAB — URINALYSIS, ROUTINE W REFLEX MICROSCOPIC
Bacteria, UA: NONE SEEN
Bilirubin Urine: NEGATIVE
Glucose, UA: NEGATIVE mg/dL
Ketones, ur: NEGATIVE mg/dL
Leukocytes,Ua: NEGATIVE
Nitrite: NEGATIVE
Protein, ur: 30 mg/dL — AB
Specific Gravity, Urine: 1.021 (ref 1.005–1.030)
pH: 5 (ref 5.0–8.0)

## 2024-06-01 LAB — COMPREHENSIVE METABOLIC PANEL WITH GFR
ALT: 107 U/L — ABNORMAL HIGH (ref 0–44)
AST: 67 U/L — ABNORMAL HIGH (ref 15–41)
Albumin: 4.2 g/dL (ref 3.5–5.0)
Alkaline Phosphatase: 105 U/L (ref 38–126)
Anion gap: 14 (ref 5–15)
BUN: 19 mg/dL (ref 6–20)
CO2: 22 mmol/L (ref 22–32)
Calcium: 9.7 mg/dL (ref 8.9–10.3)
Chloride: 101 mmol/L (ref 98–111)
Creatinine, Ser: 1.31 mg/dL — ABNORMAL HIGH (ref 0.61–1.24)
GFR, Estimated: >60 mL/min (ref >60)
Glucose, Bld: 206 mg/dL — ABNORMAL HIGH (ref 70–99)
Potassium: 4.3 mmol/L (ref 3.5–5.1)
Sodium: 138 mmol/L (ref 135–145)
Total Bilirubin: 0.5 mg/dL (ref 0.0–1.2)
Total Protein: 7.3 g/dL (ref 6.5–8.1)

## 2024-06-01 LAB — CBC
HCT: 46.8 % (ref 39.0–52.0)
Hemoglobin: 16.3 g/dL (ref 13.0–17.0)
MCH: 32.5 pg (ref 26.0–34.0)
MCHC: 34.8 g/dL (ref 30.0–36.0)
MCV: 93.2 fL (ref 80.0–100.0)
Platelets: 253 K/uL (ref 150–400)
RBC: 5.02 MIL/uL (ref 4.22–5.81)
RDW: 12 % (ref 11.5–15.5)
WBC: 9.3 K/uL (ref 4.0–10.5)
nRBC: 0 % (ref 0.0–0.2)

## 2024-06-01 LAB — LIPASE, BLOOD: Lipase: 42 U/L (ref 11–51)

## 2024-06-01 MED ORDER — SODIUM CHLORIDE 0.9 % IV BOLUS
1000.0000 mL | Freq: Once | INTRAVENOUS | Status: AC
  Administered 2024-06-01: 1000 mL via INTRAVENOUS

## 2024-06-01 MED ORDER — DIPHENHYDRAMINE HCL 50 MG/ML IJ SOLN
25.0000 mg | Freq: Once | INTRAMUSCULAR | Status: AC
  Administered 2024-06-01: 25 mg via INTRAVENOUS
  Filled 2024-06-01: qty 1

## 2024-06-01 MED ORDER — KETOROLAC TROMETHAMINE 15 MG/ML IJ SOLN
15.0000 mg | Freq: Once | INTRAMUSCULAR | Status: AC
  Administered 2024-06-01: 15 mg via INTRAVENOUS
  Filled 2024-06-01: qty 1

## 2024-06-01 MED ORDER — ONDANSETRON HCL 4 MG/2ML IJ SOLN
4.0000 mg | Freq: Once | INTRAMUSCULAR | Status: DC
  Filled 2024-06-01: qty 2

## 2024-06-01 MED ORDER — ONDANSETRON HCL 4 MG/2ML IJ SOLN
4.0000 mg | Freq: Once | INTRAMUSCULAR | Status: AC
  Administered 2024-06-01: 4 mg via INTRAMUSCULAR
  Filled 2024-06-01: qty 2

## 2024-06-01 MED ORDER — ONDANSETRON 4 MG PO TBDP
4.0000 mg | ORAL_TABLET | Freq: Three times a day (TID) | ORAL | 0 refills | Status: DC | PRN
Start: 2024-06-01 — End: 2024-06-20

## 2024-06-01 MED ORDER — TAMSULOSIN HCL 0.4 MG PO CAPS: 0.4000 mg | ORAL_CAPSULE | Freq: Every day | ORAL | 0 refills | Status: DC

## 2024-06-01 MED ORDER — MORPHINE SULFATE (PF) 4 MG/ML IV SOLN
4.0000 mg | Freq: Once | INTRAVENOUS | Status: AC
  Administered 2024-06-01: 4 mg via INTRAMUSCULAR

## 2024-06-01 MED ORDER — OXYCODONE-ACETAMINOPHEN 5-325 MG PO TABS
1.0000 | ORAL_TABLET | Freq: Once | ORAL | Status: AC
  Administered 2024-06-01: 1 via ORAL
  Filled 2024-06-01: qty 1

## 2024-06-01 MED ORDER — OXYCODONE-ACETAMINOPHEN 5-325 MG PO TABS: 1.0000 | ORAL_TABLET | Freq: Four times a day (QID) | ORAL | 0 refills | Status: DC | PRN

## 2024-06-01 MED ORDER — PROCHLORPERAZINE EDISYLATE 10 MG/2ML IJ SOLN
10.0000 mg | Freq: Once | INTRAMUSCULAR | Status: AC
  Administered 2024-06-01: 10 mg via INTRAVENOUS
  Filled 2024-06-01: qty 2

## 2024-06-01 MED ORDER — MORPHINE SULFATE (PF) 4 MG/ML IV SOLN
4.0000 mg | Freq: Once | INTRAVENOUS | Status: DC
  Filled 2024-06-01: qty 1

## 2024-06-01 NOTE — ED Triage Notes (Signed)
 Pt c/o back that radiates to his abd. Pt was told he had kidney stones to release. Pt feels it is the stones causing pain this morning.

## 2024-06-01 NOTE — ED Provider Notes (Signed)
 [facility] AT [facility] Provider Note   CSN: [id number] Arrival date & time: 06/01/24  1017     Patient presents with: Abdominal Pain and Back Pain   Andrew Roach is a 58 y.o. male.   Patient is a 58 year old male who presents to the emergency department the chief complaint of right-sided flank pain which has been ongoing since this morning.  He does admit to a history of kidney stones and this feels similar to previous.  He denies any dysuria or hematuria.  He has had associated nausea and vomiting.  He denies any constipation or diarrhea.  He has had no associated fever or chills.  He has had no recent falls or blunt abdominal wall or back trauma.   Abdominal Pain Back Pain Associated symptoms: abdominal pain        Prior to Admission medications   Medication Sig Start Date End Date Taking? Authorizing Provider  Cholecalciferol (VITAMIN D3) 50 MCG (2000 UT) TABS Take 1 tablet by mouth daily.    [provider]  dicyclomine  (BENTYL ) 20 MG tablet Take 1 tablet (20 mg total) by mouth 2 (two) times daily. 04/27/24   Andrew Lonni BIRCH, PA-C  Dulaglutide (TRULICITY) 0.75 MG/0.5ML SOPN Inject 0.75 mg into the skin once a week. Saturday or Sunday    [provider]  lisinopril (ZESTRIL) 20 MG tablet Take 20 mg by mouth daily.    [provider]  metFORMIN (GLUCOPHAGE) 1000 MG tablet Take 1,000 mg by mouth daily.    [provider]  rosuvastatin (CRESTOR) 10 MG tablet Take 10 mg by mouth daily.    [provider]  sitaGLIPtin (JANUVIA) 100 MG tablet Take 100 mg by mouth daily.    [provider]  tamsulosin  (FLOMAX ) 0.4 MG CAPS capsule Take 1 capsule (0.4 mg total) by mouth daily. 04/25/24   Triplett, Tammy, PA-C    Allergies: Shellfish allergy    Review of Systems  Gastrointestinal:  Positive for abdominal pain.  Musculoskeletal:  Positive for back pain.  All other systems reviewed and are  negative.   Updated Vital Signs BP (!) 151/93   Pulse 71   Temp (!) 97.5 F (36.4 C) (Oral)   Resp 20   Ht 5' 10 (1.778 m)   Wt 120.2 kg   SpO2 99%   BMI 38.02 kg/m   Physical Exam Vitals and nursing note reviewed.  Constitutional:      General: He is not in acute distress.    Appearance: Normal appearance. He is not ill-appearing.  HENT:     Head: Normocephalic and atraumatic.     Nose: Nose normal.     Mouth/Throat:     Mouth: Mucous membranes are moist.  Eyes:     Extraocular Movements: Extraocular movements intact.     Conjunctiva/sclera: Conjunctivae normal.     Pupils: Pupils are equal, round, and reactive to light.  Cardiovascular:     Rate and Rhythm: Normal rate and regular rhythm.     Pulses: Normal pulses.     Heart sounds: Normal heart sounds. No murmur heard.    No gallop.  Pulmonary:     Effort: Pulmonary effort is normal. No respiratory distress.     Breath sounds: Normal breath sounds. No stridor. No wheezing, rhonchi or rales.  Abdominal:     General: Abdomen is flat. Bowel sounds are normal. There is no distension. There are no signs of injury.     Palpations:  Abdomen is soft.     Tenderness: There is abdominal tenderness in the right upper quadrant and right lower quadrant. There is right CVA tenderness. There is no left CVA tenderness. Negative signs include Murphy's sign and McBurney's sign.     Hernia: No hernia is present.  Musculoskeletal:        General: Normal range of motion.     Cervical back: Normal range of motion and neck supple.  Skin:    General: Skin is warm and dry.  Neurological:     General: No focal deficit present.     Mental Status: He is alert and oriented to person, place, and time. Mental status is at baseline.  Psychiatric:        Mood and Affect: Mood normal.        Behavior: Behavior normal.        Thought Content: Thought content normal.        Judgment: Judgment normal.     (all labs ordered are listed, but  only abnormal results are displayed) Labs Reviewed  LIPASE, BLOOD  COMPREHENSIVE METABOLIC PANEL WITH GFR  CBC  URINALYSIS, ROUTINE W REFLEX MICROSCOPIC    EKG: None  Radiology: No results found.   Procedures   Medications Ordered in the ED  morphine  (PF) 4 MG/ML injection 4 mg (has no administration in time range)  ondansetron  (ZOFRAN ) injection 4 mg (has no administration in time range)  ketorolac  (TORADOL ) 15 MG/ML injection 15 mg (has no administration in time range)  sodium chloride  0.9 % bolus 1,000 mL (has no administration in time range)                                    Medical Decision Making Amount and/or Complexity of Data Reviewed Labs: ordered. Radiology: ordered.  Risk Prescription drug management.   This patient presents to the ED for concern of abdominal pain, flank pain differential diagnosis includes acute appendicitis, cholecystitis, small torsion, diverticulitis, testicular torsion, pyonephritis, kidney stone, pancreatitis, mesenteric ischemia    Additional history obtained:  Additional history obtained from medical records External records from outside source obtained and reviewed including medical records   Lab Tests:  I Ordered, and personally interpreted labs.  The pertinent results include: No leukocytosis, no anemia, mild elevation in creatinine, liver function at baseline, unremarkable electrolytes, urinalysis with large hemoglobin   Imaging Studies ordered:  I ordered imaging studies including CT scan abdomen and pelvis I independently visualized and interpreted imaging which showed right sided ureteral stone with mild to moderate hydro, no other acute surgical process I agree with the radiologist interpretation   Medicines ordered and prescription drug management:  I ordered medication including morphine , Zofran , Toradol , IV fluids, Compazine, Benadryl, Percocet for ureteral stone Reevaluation of the patient after these  medicines showed that the patient improved I have reviewed the patients home medicines and have made adjustments as needed   Problem List / ED Course:  Patient is doing much better at this time and is stable for discharge home.  Patient's pain has greatly improved with treatment in the emergency department.  Will provide additional dose of oral pain medication prior to discharge.  Did discuss patient findings with Dr. Sherrilee with urology who does agree that as long as the patient's pain is controlled he can follow-up with urology on an outpatient basis and they will work to get him into the office early next  week.  Patient does have no indication for urinary tract infection at this point.  Creatinine is mildly bumped from baseline but is stable.  He has been given IV fluids.  Vital signs are otherwise stable with no indication for sepsis.  No other acute surgical process was noted on CT scan of the abdomen and pelvis.  Do not suspect that admission is warranted at this time.  Strict turn precautions were discussed for any new or worsening symptoms.  Patient voiced understanding and had no additional questions.   Social Determinants of Health:  None        Final diagnoses:  None    ED Discharge Orders     None          Andrew Roach 06/01/24 1457    Andrew Ozell BROCKS, MD 06/01/24 1718

## 2024-06-01 NOTE — Discharge Instructions (Signed)
 Please follow-up closely with urology on an outpatient basis.  Take all medications as directed.  Return to emergency department immediately for any new or worsening symptoms.

## 2024-06-19 ENCOUNTER — Other Ambulatory Visit: Payer: Self-pay

## 2024-06-19 DIAGNOSIS — N200 Calculus of kidney: Secondary | ICD-10-CM

## 2024-06-20 ENCOUNTER — Ambulatory Visit (HOSPITAL_COMMUNITY)
Admission: RE | Admit: 2024-06-20 | Discharge: 2024-06-20 | Disposition: A | Discharge status: Home / Self Care | Source: Ambulatory Visit | Attending: Urology | Admitting: Urology

## 2024-06-20 ENCOUNTER — Ambulatory Visit: Admitting: Urology

## 2024-06-20 ENCOUNTER — Encounter: Payer: Self-pay | Admitting: Urology

## 2024-06-20 VITALS — BP 103/70 | HR 103 | Ht 70.0 in | Wt 270.0 lb

## 2024-06-20 DIAGNOSIS — N200 Calculus of kidney: Secondary | ICD-10-CM

## 2024-06-20 LAB — URINALYSIS, ROUTINE W REFLEX MICROSCOPIC
Bilirubin, UA: NEGATIVE
Glucose, UA: NEGATIVE
Ketones, UA: NEGATIVE
Leukocytes,UA: NEGATIVE
Nitrite, UA: NEGATIVE
Protein,UA: NEGATIVE
Specific Gravity, UA: 1.015 (ref 1.005–1.030)
Urobilinogen, Ur: 0.2 mg/dL (ref 0.2–1.0)
pH, UA: 5.5 (ref 5.0–7.5)

## 2024-06-20 LAB — MICROSCOPIC EXAMINATION: Bacteria, UA: NONE SEEN

## 2024-06-20 MED ORDER — ONDANSETRON 4 MG PO TBDP: 4.0000 mg | ORAL_TABLET | Freq: Three times a day (TID) | ORAL | 0 refills | Status: DC | PRN

## 2024-06-20 MED ORDER — TAMSULOSIN HCL 0.4 MG PO CAPS: 0.4000 mg | ORAL_CAPSULE | Freq: Every day | ORAL | 0 refills | Status: DC

## 2024-06-20 MED ORDER — OXYCODONE-ACETAMINOPHEN 5-325 MG PO TABS: 1.0000 | ORAL_TABLET | Freq: Four times a day (QID) | ORAL | 0 refills | Status: DC | PRN

## 2024-06-20 NOTE — Progress Notes (Signed)
 06/20/2024 9:54 AM   Andrew Roach Ransom 1966-03-06 [id number]  Referring provider: Myra Geni ORN, FNP [address] [place],  [place] [address]  nephrolithiasis   HPI: Andrew Roach is a 58yo here for evaluation of nephrolithiasis. He presented to the ER 10/3 with right flank pain and was diagnosed with a 6mm right UPJ calculus. He did not passed his calculus. He started getting stones 18 years ago and was followed by a urologist in [place]. He passed a calculus on the left in August 2025. Currently he does not have any right flank pain.    PMH: Past Medical History:  Diagnosis Date   Arthritis    Hyperlipidemia    Hypertension    Kidney stones    Type 2 diabetes mellitus (HCC)     Surgical History: Past Surgical History:  Procedure Laterality Date   APPENDECTOMY     ARTHRODESIS METATARSALPHALANGEAL JOINT (MTPJ) Right 06/16/2023   Procedure: ARTHRODESIS METATARSALPHALANGEAL JOINT (MTPJ) FIRST;  Surgeon: Lennie Barter, DPM;  Location: [facility];  Service: Orthopedics/Podiatry;  Laterality: Right;  Diabetic   CHOLECYSTECTOMY      Home Medications:  Allergies as of 06/20/2024       Reactions   Shellfish Allergy Anaphylaxis        Medication List        Accurate as of June 20, 2024  9:54 AM. If you have any questions, ask your nurse or doctor.          dicyclomine  20 MG tablet Commonly known as: BENTYL  Take 1 tablet (20 mg total) by mouth 2 (two) times daily.   lisinopril 20 MG tablet Commonly known as: ZESTRIL Take 20 mg by mouth daily.   metFORMIN 1000 MG tablet Commonly known as: GLUCOPHAGE Take 1,000 mg by mouth daily.   ondansetron  4 MG disintegrating tablet Commonly known as: ZOFRAN -ODT Take 1 tablet (4 mg total) by mouth every 8 (eight) hours as needed for nausea or vomiting.   oxyCODONE -acetaminophen  5-325 MG tablet Commonly known as: PERCOCET/ROXICET Take 1 tablet by mouth every 6 (six) hours as needed for severe pain (pain score  7-10).   rosuvastatin 10 MG tablet Commonly known as: CRESTOR Take 10 mg by mouth daily.   sitaGLIPtin 100 MG tablet Commonly known as: JANUVIA Take 100 mg by mouth daily.   tamsulosin  0.4 MG Caps capsule Commonly known as: FLOMAX  Take 1 capsule (0.4 mg total) by mouth daily.   Trulicity 0.75 MG/0.5ML Soaj Generic drug: Dulaglutide Inject 0.75 mg into the skin once a week. Saturday or Sunday   Vitamin D3 50 MCG (2000 UT) Tabs Take 1 tablet by mouth daily.        Allergies:  Allergies  Allergen Reactions   Shellfish Allergy Anaphylaxis    Family History: No family history on file.  Social History:  reports that he has never smoked. He quit smokeless tobacco use about 16 years ago.  His smokeless tobacco use included snuff and chew. He reports current alcohol use. He reports that he does not use drugs.  ROS: All other review of systems were reviewed and are negative except what is noted above in HPI  Physical Exam: BP 103/70   Pulse (!) 103   Ht 5' 10 (1.778 m)   Wt 270 lb (122.5 kg)   BMI 38.74 kg/m   Constitutional:  Alert and oriented, No acute distress. HEENT: [place] AT, moist mucus membranes.  Trachea midline, no masses. Cardiovascular: No clubbing, cyanosis, or edema. Respiratory: Normal respiratory  effort, no increased work of breathing. GI: Abdomen is soft, nontender, nondistended, no abdominal masses GU: No CVA tenderness.  Lymph: No cervical or inguinal lymphadenopathy. Skin: No rashes, bruises or suspicious lesions. Neurologic: Grossly intact, no focal deficits, moving all 4 extremities. Psychiatric: Normal mood and affect.  Laboratory Data: Lab Results  Component Value Date   WBC 9.3 06/01/2024   HGB 16.3 06/01/2024   HCT 46.8 06/01/2024   MCV 93.2 06/01/2024   PLT 253 06/01/2024    Lab Results  Component Value Date   CREATININE 1.31 (H) 06/01/2024    No results found for: PSA  No results found for: TESTOSTERONE  Lab Results   Component Value Date   HGBA1C 8.6 (H) 05/01/2024    Urinalysis    Component Value Date/Time   COLORURINE YELLOW 06/01/2024 1212   APPEARANCEUR HAZY (A) 06/01/2024 1212   LABSPEC 1.021 06/01/2024 1212   PHURINE 5.0 06/01/2024 1212   GLUCOSEU NEGATIVE 06/01/2024 1212   HGBUR LARGE (A) 06/01/2024 1212   BILIRUBINUR NEGATIVE 06/01/2024 1212   KETONESUR NEGATIVE 06/01/2024 1212   PROTEINUR 30 (A) 06/01/2024 1212   NITRITE NEGATIVE 06/01/2024 1212   LEUKOCYTESUR NEGATIVE 06/01/2024 1212    Lab Results  Component Value Date   BACTERIA NONE SEEN 06/01/2024    Pertinent Imaging: CT 10/3 and KUB today: Images reviewed and discussed with the patient  Results for orders placed during the hospital encounter of 01/27/23  DG Abdomen 1 View  Narrative CLINICAL DATA:  Left flank pain  EXAM: ABDOMEN - 1 VIEW  COMPARISON:  None Available.  FINDINGS: Bowel gas pattern is nonspecific. No abnormal masses are seen. There is a 9 mm calcific density in left side of pelvis. Possible faint calcific densities are seen in right upper quadrant of abdomen. Degenerative changes with bony spurs are seen in lumbar spine.  IMPRESSION: There is a 9 mm calcific density in left side of pelvis which may suggest a vascular calcification or calculus in the distal left ureter. Faint calcifications are seen in right upper quadrant of abdomen suggesting possible gallbladder stones.   Electronically Signed By: Gearldine Mary M.D. On: 01/27/2023 10:53  No results found for this or any previous visit.  No results found for this or any previous visit.  No results found for this or any previous visit.  No results found for this or any previous visit.  No results found for this or any previous visit.  No results found for this or any previous visit.  Results for orders placed during the hospital encounter of 06/01/24  CT Renal Stone Study  Narrative CLINICAL DATA:  Abdominal/flank pain,  stone suspected  EXAM: CT ABDOMEN AND PELVIS WITHOUT CONTRAST  TECHNIQUE: Multidetector CT imaging of the abdomen and pelvis was performed following the standard protocol without IV contrast.  RADIATION DOSE REDUCTION: This exam was performed according to the departmental dose-optimization program which includes automated exposure control, adjustment of the mA and/or kV according to patient size and/or use of iterative reconstruction technique.  COMPARISON:  05/09/2024  FINDINGS: Of note, the lack of intravenous contrast limits evaluation of the solid organ parenchyma and vascularity.  Lower chest: No focal airspace consolidation or pleural effusion.  Hepatobiliary: No mass.Mild hepatic steatosis.Cholecystectomy. No intrahepatic or extrahepatic biliary ductal dilation.  Pancreas: No mass or main ductal dilation. No peripancreatic inflammation or fluid collection.  Spleen: Normal size. No mass.  Adrenals/Urinary Tract: No adrenal masses. No renal mass. Moderate right-sided perinephric stranding. There is an obstructive  ureteral calculus at the right UPJ measuring 4 x 6 x 6 mm causing mild-to-moderate hydroureteronephrosis. The urinary bladder is distended without focal abnormality.  Stomach/Bowel: The stomach is decompressed without focal abnormality. No small bowel wall thickening or inflammation. No small bowel obstruction.The appendix was not visualized. No right lower quadrant or pericecal inflammatory changes to suggest acute appendicitis. Sigmoid colonic diverticulosis. No changes of acute diverticulitis.  Vascular/Lymphatic: No aortic aneurysm. Scattered aortoiliac atherosclerosis. No intraabdominal or pelvic lymphadenopathy.  Reproductive: Moderate prostatomegaly.  No free pelvic fluid.  Other: No pneumoperitoneum, ascites, or mesenteric inflammation.  Musculoskeletal: No acute fracture or destructive lesion. Multilevel degenerative disc disease of the  spine.  IMPRESSION: 1. Obstructive ureteral calculus at the right UPJ, measuring 4 x 6 x 6 mm, causing mild to moderate hydroureteronephrosis and perinephric stranding. 2. Interval passage of the distal left ureteral calculus with resolution of the associated hydroureter. 3. Descending and sigmoid colonic diverticulosis. No changes of acute diverticulitis. 4. Moderate prostatomegaly. 5. Hepatic steatosis.  Aortic Atherosclerosis (ICD10-I70.0).   Electronically Signed By: Rogelia Myers M.D. On: 06/01/2024 12:20   Assessment & Plan:    1. Kidney stones (Primary) -We discussed the management of kidney stones. These options include observation, ureteroscopy, shockwave lithotripsy (ESWL) and percutaneous nephrolithotomy (PCNL). We discussed which options are relevant to the patient's stone(s). We discussed the natural history of kidney stones as well as the complications of untreated stones and the impact on quality of life without treatment as well as with each of the above listed treatments. We also discussed the efficacy of each treatment in its ability to clear the stone burden. With any of these management options I discussed the signs and symptoms of infection and the need for emergent treatment should these be experienced. For each option we discussed the ability of each procedure to clear the patient of their stone burden.   For observation I described the risks which include but are not limited to silent renal damage, life-threatening infection, need for emergent surgery, failure to pass stone and pain.   For ureteroscopy I described the risks which include bleeding, infection, damage to contiguous structures, positioning injury, ureteral stricture, ureteral avulsion, ureteral injury, need for prolonged ureteral stent, inability to perform ureteroscopy, need for an interval procedure, inability to clear stone burden, stent discomfort/pain, heart attack, stroke, pulmonary embolus and  the inherent risks with general anesthesia.   For shockwave lithotripsy I described the risks which include arrhythmia, kidney contusion, kidney hemorrhage, need for transfusion, pain, inability to adequately break up stone, inability to pass stone fragments, Steinstrasse, infection associated with obstructing stones, need for alternate surgical procedure, need for repeat shockwave lithotripsy, MI, CVA, PE and the inherent risks with anesthesia/conscious sedation.   For PCNL I described the risks including positioning injury, pneumothorax, hydrothorax, need for chest tube, inability to clear stone burden, renal laceration, arterial venous fistula or malformation, need for embolization of kidney, loss of kidney or renal function, need for repeat procedure, need for prolonged nephrostomy tube, ureteral avulsion, MI, CVA, PE and the inherent risks of general anesthesia.   - The patient would like to proceed with medical expulsive therapy. I will see him back in 2 weeks with a KUB - Urinalysis, Routine w reflex microscopic   No follow-ups on file.  [name], MD  [facility] [place]

## 2024-06-20 NOTE — Patient Instructions (Signed)

## 2024-07-18 ENCOUNTER — Ambulatory Visit: Admitting: Urology

## 2024-07-18 ENCOUNTER — Ambulatory Visit (HOSPITAL_COMMUNITY)
Admission: RE | Admit: 2024-07-18 | Discharge: 2024-07-18 | Disposition: A | Discharge status: Home / Self Care | Source: Ambulatory Visit | Attending: Urology | Admitting: Urology

## 2024-07-18 ENCOUNTER — Other Ambulatory Visit: Payer: Self-pay

## 2024-07-18 ENCOUNTER — Encounter: Payer: Self-pay | Admitting: Urology

## 2024-07-18 VITALS — BP 131/70 | HR 77

## 2024-07-18 DIAGNOSIS — N200 Calculus of kidney: Secondary | ICD-10-CM | POA: Insufficient documentation

## 2024-07-18 DIAGNOSIS — N201 Calculus of ureter: Secondary | ICD-10-CM | POA: Diagnosis not present

## 2024-07-18 LAB — URINALYSIS, ROUTINE W REFLEX MICROSCOPIC
Bilirubin, UA: NEGATIVE
Ketones, UA: NEGATIVE
Leukocytes,UA: NEGATIVE
Nitrite, UA: NEGATIVE
Protein,UA: NEGATIVE
RBC, UA: NEGATIVE
Specific Gravity, UA: 1.02 (ref 1.005–1.030)
Urobilinogen, Ur: 0.2 mg/dL (ref 0.2–1.0)
pH, UA: 5.5 (ref 5.0–7.5)

## 2024-07-18 MED ORDER — OXYCODONE-ACETAMINOPHEN 5-325 MG PO TABS: 1.0000 | ORAL_TABLET | Freq: Four times a day (QID) | ORAL | 0 refills | Status: DC | PRN

## 2024-07-18 MED ORDER — TAMSULOSIN HCL 0.4 MG PO CAPS: 0.4000 mg | ORAL_CAPSULE | Freq: Every day | ORAL | 0 refills | Status: DC

## 2024-07-18 NOTE — Patient Instructions (Signed)
 ESWL for Kidney Stones  Extracorporeal shock wave lithotripsy (ESWL) is a treatment that can help break up kidney stones that are too large to pass on their own.  This is a nonsurgical procedure that breaks up a kidney stone with shock waves. These shock waves pass through your body and focus on the kidney stone. They cause the kidney stone to break into smaller pieces (fragments) while it is still in the urinary tract. The fragments of stone can pass more easily out of your body in the pee (urine). Tell a health care provider about: Any allergies you have. All medicines you are taking, including vitamins, herbs, eye drops, creams, and over-the-counter medicines. Any problems you or family members have had with anesthetic medicines. Any bleeding problems you have. Any surgeries you've had. Any medical conditions you have. Whether you're pregnant or may be pregnant. What are the risks? Your health care provider will talk with you about risks. These may include: Infection. Bleeding from the kidney. Bruising of the kidney or skin. Scarring of the kidney. This can lead to: Increased blood pressure. Poor kidney function. Return (recurrence) of kidney stones. Damage to other structures or organs. This may include the liver, colon, spleen, or pancreas. Blockage (obstruction) of the tube that carries pee from the kidney to the bladder (ureter). Failure of the kidney stone to break into fragments. What happens before the procedure? When to stop eating and drinking Follow instructions from your health care provider about what you may eat and drink. These may include: 8 hours before your procedure Stop eating most foods. Do not eat meat, fried foods, or fatty foods. Eat only light foods, such as toast or crackers. All liquids are okay except energy drinks and alcohol. 6 hours before your procedure Stop eating. Drink only clear liquids, such as water, clear fruit juice, black coffee, plain tea,  and sports drinks. Do not drink energy drinks or alcohol. 2 hours before your procedure Stop drinking all liquids. You may be allowed to take medicines with small sips of water. If you do not follow your health care provider's instructions, your procedure may be delayed or canceled. Medicines Ask your health care provider about: Changing or stopping your regular medicines. These include any diabetes medicines or blood thinners you take. Taking medicines such as aspirin and ibuprofen. These medicines can thin your blood. Do not take them unless your health care provider tells you to. Taking over-the-counter medicines, vitamins, herbs, and supplements. Tests You may have tests, such as: Blood tests. Pee (urine) tests. Imaging tests. This may include a CT scan. Surgery safety Ask your health care provider: How your surgery site will be marked. What steps will be taken to help prevent infection. These steps may include: Washing skin with a soap that kills germs. Receiving antibiotics. General instructions If you will be going home right after the procedure, plan to have a responsible adult: Take you home from the hospital or clinic. You will not be allowed to drive. Care for you for the time you are told. What happens during the procedure?  An IV will be inserted into one of your veins. You may be given: A sedative. This helps you relax. Anesthesia. This will: Numb certain areas of your body. Make you fall asleep for surgery. A water-filled cushion may be placed behind your kidney or on your abdomen. In some cases, you may be placed in a tub of lukewarm water. Your body will be positioned in a way that makes it  easier to target the kidney stone. An X-ray or ultrasound exam will be done to locate your stone. Shock waves will be aimed at the stone. If you are awake, you may feel a tapping sensation as the shock waves pass through your body. A small mesh tube (stent) may be placed in  your ureter. This will help keep pee flowing from the kidney if the fragments of the stone have been blocking the ureter. The stent will be removed at a later time by your health care provider. The procedure may vary among health care providers and hospitals. What happens after the procedure? Your blood pressure, heart rate, breathing rate, and blood oxygen level will be monitored until you leave the hospital or clinic. You may have an X-ray after the procedure to see how many of the kidney stones were broken up. This will also show how much of the stone has passed. If there are still large fragments after treatment, you may need to have a second procedure at a later time. This information is not intended to replace advice given to you by your health care provider. Make sure you discuss any questions you have with your health care provider. Document Revised: 02/26/2023 Document Reviewed: 12/17/2021 Elsevier Patient Education  2024 [employer].

## 2024-07-18 NOTE — H&P (View-Only) (Signed)
 07/18/2024 10:36 AM   Tanda ONEIDA Ransom 10/13/1965 [id number]  Referring provider: Myra Geni ORN, FNP [address] [place],  [place] [address]  Followup nephrolithiasis   HPI: Mr Fordham is a 58yo here for followup for nephrolithiasis. He has not passed his right ureteral calculus. He continues to have intermittent right flank pain.  KUb from today shows right ureteral calculus at L4. No worsening LUTS   PMH: Past Medical History:  Diagnosis Date   Arthritis    Hyperlipidemia    Hypertension    Kidney stones    Type 2 diabetes mellitus (HCC)     Surgical History: Past Surgical History:  Procedure Laterality Date   APPENDECTOMY     ARTHRODESIS METATARSALPHALANGEAL JOINT (MTPJ) Right 06/16/2023   Procedure: ARTHRODESIS METATARSALPHALANGEAL JOINT (MTPJ) FIRST;  Surgeon: Lennie Barter, DPM;  Location: [facility];  Service: Orthopedics/Podiatry;  Laterality: Right;  Diabetic   CHOLECYSTECTOMY      Home Medications:  Allergies as of 07/18/2024       Reactions   Shellfish Allergy Anaphylaxis        Medication List        Accurate as of July 18, 2024 10:36 AM. If you have any questions, ask your nurse or doctor.          dicyclomine  20 MG tablet Commonly known as: BENTYL  Take 1 tablet (20 mg total) by mouth 2 (two) times daily.   lisinopril 20 MG tablet Commonly known as: ZESTRIL Take 20 mg by mouth daily.   metFORMIN 1000 MG tablet Commonly known as: GLUCOPHAGE Take 1,000 mg by mouth daily.   ondansetron  4 MG disintegrating tablet Commonly known as: ZOFRAN -ODT Take 1 tablet (4 mg total) by mouth every 8 (eight) hours as needed for nausea or vomiting.   oxyCODONE -acetaminophen  5-325 MG tablet Commonly known as: PERCOCET/ROXICET Take 1 tablet by mouth every 6 (six) hours as needed for severe pain (pain score 7-10).   rosuvastatin 10 MG tablet Commonly known as: CRESTOR Take 10 mg by mouth daily.   sitaGLIPtin 100 MG tablet Commonly known  as: JANUVIA Take 100 mg by mouth daily.   tamsulosin  0.4 MG Caps capsule Commonly known as: FLOMAX  Take 1 capsule (0.4 mg total) by mouth daily.   Trulicity 0.75 MG/0.5ML Soaj Generic drug: Dulaglutide Inject 0.75 mg into the skin once a week. Saturday or Sunday   Vitamin D3 50 MCG (2000 UT) Tabs Take 1 tablet by mouth daily.        Allergies:  Allergies  Allergen Reactions   Shellfish Allergy Anaphylaxis    Family History: No family history on file.  Social History:  reports that he has never smoked. He quit smokeless tobacco use about 16 years ago.  His smokeless tobacco use included snuff and chew. He reports current alcohol use. He reports that he does not use drugs.  ROS: All other review of systems were reviewed and are negative except what is noted above in HPI  Physical Exam: BP 131/70   Pulse 77   Constitutional:  Alert and oriented, No acute distress. HEENT: [place] AT, moist mucus membranes.  Trachea midline, no masses. Cardiovascular: No clubbing, cyanosis, or edema. Respiratory: Normal respiratory effort, no increased work of breathing. GI: Abdomen is soft, nontender, nondistended, no abdominal masses GU: No CVA tenderness.  Lymph: No cervical or inguinal lymphadenopathy. Skin: No rashes, bruises or suspicious lesions. Neurologic: Grossly intact, no focal deficits, moving all 4 extremities. Psychiatric: Normal mood and affect.  Laboratory Data:  Lab Results  Component Value Date   WBC 9.3 06/01/2024   HGB 16.3 06/01/2024   HCT 46.8 06/01/2024   MCV 93.2 06/01/2024   PLT 253 06/01/2024    Lab Results  Component Value Date   CREATININE 1.31 (H) 06/01/2024    No results found for: PSA  No results found for: TESTOSTERONE  Lab Results  Component Value Date   HGBA1C 8.6 (H) 05/01/2024    Urinalysis    Component Value Date/Time   COLORURINE YELLOW 06/01/2024 1212   APPEARANCEUR Clear 06/20/2024 0923   LABSPEC 1.021 06/01/2024 1212    PHURINE 5.0 06/01/2024 1212   GLUCOSEU Negative 06/20/2024 0923   HGBUR LARGE (A) 06/01/2024 1212   BILIRUBINUR Negative 06/20/2024 0923   KETONESUR NEGATIVE 06/01/2024 1212   PROTEINUR Negative 06/20/2024 0923   PROTEINUR 30 (A) 06/01/2024 1212   NITRITE Negative 06/20/2024 0923   NITRITE NEGATIVE 06/01/2024 1212   LEUKOCYTESUR Negative 06/20/2024 0923   LEUKOCYTESUR NEGATIVE 06/01/2024 1212    Lab Results  Component Value Date   LABMICR See below: 06/20/2024   WBCUA 0-5 06/20/2024   LABEPIT 0-10 06/20/2024   BACTERIA None seen 06/20/2024    Pertinent Imaging: KUb today: Images reviewed and discussed with the patient  Results for orders placed in visit on 06/19/24  DG Abd 1 View  Narrative EXAM: 1 VIEW XRAY OF THE ABDOMEN 06/20/2024 08:59:00 AM  COMPARISON: CT abdomen and pelvis 10 through 25.  CLINICAL HISTORY: kidney stone. Right sided flank pain x 2 weeks. ; Pain with urination.  FINDINGS:  BOWEL: Mild stool burden in the ascending colon. Nonobstructive bowel gas pattern.  SOFT TISSUES: Pelvic phleboliths. There is a 4 mm calculus in the expected location of the right renal pelvis concerning for nephrolithiasis finding overall similar to recent CT.  BONES: Degenerative changes in the lumbar spine. No acute osseous abnormality.  IMPRESSION: 1. 4 mm calculus in the expected location of the right renal pelvis, concerning for nephrolithiasis, similar to recent CT. 2. Mild stool burden in the ascending colon. No bowel obstruction. 3. Degenerative changes in the lumbar spine.  Electronically signed by: Donnice Mania MD 06/21/2024 09:17 PM EDT RP Workstation: [id number]  No results found for this or any previous visit.  No results found for this or any previous visit.  No results found for this or any previous visit.  No results found for this or any previous visit.  No results found for this or any previous visit.  No results found for this or any  previous visit.  Results for orders placed during the hospital encounter of 06/01/24  CT Renal Stone Study  Narrative CLINICAL DATA:  Abdominal/flank pain, stone suspected  EXAM: CT ABDOMEN AND PELVIS WITHOUT CONTRAST  TECHNIQUE: Multidetector CT imaging of the abdomen and pelvis was performed following the standard protocol without IV contrast.  RADIATION DOSE REDUCTION: This exam was performed according to the departmental dose-optimization program which includes automated exposure control, adjustment of the mA and/or kV according to patient size and/or use of iterative reconstruction technique.  COMPARISON:  05/09/2024  FINDINGS: Of note, the lack of intravenous contrast limits evaluation of the solid organ parenchyma and vascularity.  Lower chest: No focal airspace consolidation or pleural effusion.  Hepatobiliary: No mass.Mild hepatic steatosis.Cholecystectomy. No intrahepatic or extrahepatic biliary ductal dilation.  Pancreas: No mass or main ductal dilation. No peripancreatic inflammation or fluid collection.  Spleen: Normal size. No mass.  Adrenals/Urinary Tract: No adrenal masses. No renal mass. Moderate right-sided  perinephric stranding. There is an obstructive ureteral calculus at the right UPJ measuring 4 x 6 x 6 mm causing mild-to-moderate hydroureteronephrosis. The urinary bladder is distended without focal abnormality.  Stomach/Bowel: The stomach is decompressed without focal abnormality. No small bowel wall thickening or inflammation. No small bowel obstruction.The appendix was not visualized. No right lower quadrant or pericecal inflammatory changes to suggest acute appendicitis. Sigmoid colonic diverticulosis. No changes of acute diverticulitis.  Vascular/Lymphatic: No aortic aneurysm. Scattered aortoiliac atherosclerosis. No intraabdominal or pelvic lymphadenopathy.  Reproductive: Moderate prostatomegaly.  No free pelvic fluid.  Other: No  pneumoperitoneum, ascites, or mesenteric inflammation.  Musculoskeletal: No acute fracture or destructive lesion. Multilevel degenerative disc disease of the spine.  IMPRESSION: 1. Obstructive ureteral calculus at the right UPJ, measuring 4 x 6 x 6 mm, causing mild to moderate hydroureteronephrosis and perinephric stranding. 2. Interval passage of the distal left ureteral calculus with resolution of the associated hydroureter. 3. Descending and sigmoid colonic diverticulosis. No changes of acute diverticulitis. 4. Moderate prostatomegaly. 5. Hepatic steatosis.  Aortic Atherosclerosis (ICD10-I70.0).   Electronically Signed By: Rogelia Myers M.D. On: 06/01/2024 12:20   Assessment & Plan:    1. Kidney stones (Primary) -We discussed the management of kidney stones. These options include observation, ureteroscopy, shockwave lithotripsy (ESWL) and percutaneous nephrolithotomy (PCNL). We discussed which options are relevant to the patient's stone(s). We discussed the natural history of kidney stones as well as the complications of untreated stones and the impact on quality of life without treatment as well as with each of the above listed treatments. We also discussed the efficacy of each treatment in its ability to clear the stone burden. With any of these management options I discussed the signs and symptoms of infection and the need for emergent treatment should these be experienced. For each option we discussed the ability of each procedure to clear the patient of their stone burden.   For observation I described the risks which include but are not limited to silent renal damage, life-threatening infection, need for emergent surgery, failure to pass stone and pain.   For ureteroscopy I described the risks which include bleeding, infection, damage to contiguous structures, positioning injury, ureteral stricture, ureteral avulsion, ureteral injury, need for prolonged ureteral stent,  inability to perform ureteroscopy, need for an interval procedure, inability to clear stone burden, stent discomfort/pain, heart attack, stroke, pulmonary embolus and the inherent risks with general anesthesia.   For shockwave lithotripsy I described the risks which include arrhythmia, kidney contusion, kidney hemorrhage, need for transfusion, pain, inability to adequately break up stone, inability to pass stone fragments, Steinstrasse, infection associated with obstructing stones, need for alternate surgical procedure, need for repeat shockwave lithotripsy, MI, CVA, PE and the inherent risks with anesthesia/conscious sedation.   For PCNL I described the risks including positioning injury, pneumothorax, hydrothorax, need for chest tube, inability to clear stone burden, renal laceration, arterial venous fistula or malformation, need for embolization of kidney, loss of kidney or renal function, need for repeat procedure, need for prolonged nephrostomy tube, ureteral avulsion, MI, CVA, PE and the inherent risks of general anesthesia.   - The patient would like to proceed with right ESWL - Urinalysis, Routine w reflex microscopic   No follow-ups on file.  [name], MD  [facility] [place]

## 2024-07-18 NOTE — Progress Notes (Signed)
 07/18/2024 10:36 AM   Tanda ONEIDA Ransom Dec 09, 1965 [id number]  Referring provider: Myra Geni ORN, FNP [address] [place],  [place] [address]  Followup nephrolithiasis   HPI: Mr Kabir is a 58yo here for followup for nephrolithiasis. He has not passed his right ureteral calculus. He continues to have intermittent right flank pain.  KUb from today shows right ureteral calculus at L4. No worsening LUTS   PMH: Past Medical History:  Diagnosis Date   Arthritis    Hyperlipidemia    Hypertension    Kidney stones    Type 2 diabetes mellitus (HCC)     Surgical History: Past Surgical History:  Procedure Laterality Date   APPENDECTOMY     ARTHRODESIS METATARSALPHALANGEAL JOINT (MTPJ) Right 06/16/2023   Procedure: ARTHRODESIS METATARSALPHALANGEAL JOINT (MTPJ) FIRST;  Surgeon: Lennie Barter, DPM;  Location: [facility];  Service: Orthopedics/Podiatry;  Laterality: Right;  Diabetic   CHOLECYSTECTOMY      Home Medications:  Allergies as of 07/18/2024       Reactions   Shellfish Allergy Anaphylaxis        Medication List        Accurate as of July 18, 2024 10:36 AM. If you have any questions, ask your nurse or doctor.          dicyclomine  20 MG tablet Commonly known as: BENTYL  Take 1 tablet (20 mg total) by mouth 2 (two) times daily.   lisinopril 20 MG tablet Commonly known as: ZESTRIL Take 20 mg by mouth daily.   metFORMIN 1000 MG tablet Commonly known as: GLUCOPHAGE Take 1,000 mg by mouth daily.   ondansetron  4 MG disintegrating tablet Commonly known as: ZOFRAN -ODT Take 1 tablet (4 mg total) by mouth every 8 (eight) hours as needed for nausea or vomiting.   oxyCODONE -acetaminophen  5-325 MG tablet Commonly known as: PERCOCET/ROXICET Take 1 tablet by mouth every 6 (six) hours as needed for severe pain (pain score 7-10).   rosuvastatin 10 MG tablet Commonly known as: CRESTOR Take 10 mg by mouth daily.   sitaGLIPtin 100 MG tablet Commonly known  as: JANUVIA Take 100 mg by mouth daily.   tamsulosin  0.4 MG Caps capsule Commonly known as: FLOMAX  Take 1 capsule (0.4 mg total) by mouth daily.   Trulicity 0.75 MG/0.5ML Soaj Generic drug: Dulaglutide Inject 0.75 mg into the skin once a week. Saturday or Sunday   Vitamin D3 50 MCG (2000 UT) Tabs Take 1 tablet by mouth daily.        Allergies:  Allergies  Allergen Reactions   Shellfish Allergy Anaphylaxis    Family History: No family history on file.  Social History:  reports that he has never smoked. He quit smokeless tobacco use about 16 years ago.  His smokeless tobacco use included snuff and chew. He reports current alcohol use. He reports that he does not use drugs.  ROS: All other review of systems were reviewed and are negative except what is noted above in HPI  Physical Exam: BP 131/70   Pulse 77   Constitutional:  Alert and oriented, No acute distress. HEENT: [place] AT, moist mucus membranes.  Trachea midline, no masses. Cardiovascular: No clubbing, cyanosis, or edema. Respiratory: Normal respiratory effort, no increased work of breathing. GI: Abdomen is soft, nontender, nondistended, no abdominal masses GU: No CVA tenderness.  Lymph: No cervical or inguinal lymphadenopathy. Skin: No rashes, bruises or suspicious lesions. Neurologic: Grossly intact, no focal deficits, moving all 4 extremities. Psychiatric: Normal mood and affect.  Laboratory Data:  Lab Results  Component Value Date   WBC 9.3 06/01/2024   HGB 16.3 06/01/2024   HCT 46.8 06/01/2024   MCV 93.2 06/01/2024   PLT 253 06/01/2024    Lab Results  Component Value Date   CREATININE 1.31 (H) 06/01/2024    No results found for: PSA  No results found for: TESTOSTERONE  Lab Results  Component Value Date   HGBA1C 8.6 (H) 05/01/2024    Urinalysis    Component Value Date/Time   COLORURINE YELLOW 06/01/2024 1212   APPEARANCEUR Clear 06/20/2024 0923   LABSPEC 1.021 06/01/2024 1212    PHURINE 5.0 06/01/2024 1212   GLUCOSEU Negative 06/20/2024 0923   HGBUR LARGE (A) 06/01/2024 1212   BILIRUBINUR Negative 06/20/2024 0923   KETONESUR NEGATIVE 06/01/2024 1212   PROTEINUR Negative 06/20/2024 0923   PROTEINUR 30 (A) 06/01/2024 1212   NITRITE Negative 06/20/2024 0923   NITRITE NEGATIVE 06/01/2024 1212   LEUKOCYTESUR Negative 06/20/2024 0923   LEUKOCYTESUR NEGATIVE 06/01/2024 1212    Lab Results  Component Value Date   LABMICR See below: 06/20/2024   WBCUA 0-5 06/20/2024   LABEPIT 0-10 06/20/2024   BACTERIA None seen 06/20/2024    Pertinent Imaging: KUb today: Images reviewed and discussed with the patient  Results for orders placed in visit on 06/19/24  DG Abd 1 View  Narrative EXAM: 1 VIEW XRAY OF THE ABDOMEN 06/20/2024 08:59:00 AM  COMPARISON: CT abdomen and pelvis 10 through 25.  CLINICAL HISTORY: kidney stone. Right sided flank pain x 2 weeks. ; Pain with urination.  FINDINGS:  BOWEL: Mild stool burden in the ascending colon. Nonobstructive bowel gas pattern.  SOFT TISSUES: Pelvic phleboliths. There is a 4 mm calculus in the expected location of the right renal pelvis concerning for nephrolithiasis finding overall similar to recent CT.  BONES: Degenerative changes in the lumbar spine. No acute osseous abnormality.  IMPRESSION: 1. 4 mm calculus in the expected location of the right renal pelvis, concerning for nephrolithiasis, similar to recent CT. 2. Mild stool burden in the ascending colon. No bowel obstruction. 3. Degenerative changes in the lumbar spine.  Electronically signed by: Donnice Mania MD 06/21/2024 09:17 PM EDT RP Workstation: [id number]  No results found for this or any previous visit.  No results found for this or any previous visit.  No results found for this or any previous visit.  No results found for this or any previous visit.  No results found for this or any previous visit.  No results found for this or any  previous visit.  Results for orders placed during the hospital encounter of 06/01/24  CT Renal Stone Study  Narrative CLINICAL DATA:  Abdominal/flank pain, stone suspected  EXAM: CT ABDOMEN AND PELVIS WITHOUT CONTRAST  TECHNIQUE: Multidetector CT imaging of the abdomen and pelvis was performed following the standard protocol without IV contrast.  RADIATION DOSE REDUCTION: This exam was performed according to the departmental dose-optimization program which includes automated exposure control, adjustment of the mA and/or kV according to patient size and/or use of iterative reconstruction technique.  COMPARISON:  05/09/2024  FINDINGS: Of note, the lack of intravenous contrast limits evaluation of the solid organ parenchyma and vascularity.  Lower chest: No focal airspace consolidation or pleural effusion.  Hepatobiliary: No mass.Mild hepatic steatosis.Cholecystectomy. No intrahepatic or extrahepatic biliary ductal dilation.  Pancreas: No mass or main ductal dilation. No peripancreatic inflammation or fluid collection.  Spleen: Normal size. No mass.  Adrenals/Urinary Tract: No adrenal masses. No renal mass. Moderate right-sided  perinephric stranding. There is an obstructive ureteral calculus at the right UPJ measuring 4 x 6 x 6 mm causing mild-to-moderate hydroureteronephrosis. The urinary bladder is distended without focal abnormality.  Stomach/Bowel: The stomach is decompressed without focal abnormality. No small bowel wall thickening or inflammation. No small bowel obstruction.The appendix was not visualized. No right lower quadrant or pericecal inflammatory changes to suggest acute appendicitis. Sigmoid colonic diverticulosis. No changes of acute diverticulitis.  Vascular/Lymphatic: No aortic aneurysm. Scattered aortoiliac atherosclerosis. No intraabdominal or pelvic lymphadenopathy.  Reproductive: Moderate prostatomegaly.  No free pelvic fluid.  Other: No  pneumoperitoneum, ascites, or mesenteric inflammation.  Musculoskeletal: No acute fracture or destructive lesion. Multilevel degenerative disc disease of the spine.  IMPRESSION: 1. Obstructive ureteral calculus at the right UPJ, measuring 4 x 6 x 6 mm, causing mild to moderate hydroureteronephrosis and perinephric stranding. 2. Interval passage of the distal left ureteral calculus with resolution of the associated hydroureter. 3. Descending and sigmoid colonic diverticulosis. No changes of acute diverticulitis. 4. Moderate prostatomegaly. 5. Hepatic steatosis.  Aortic Atherosclerosis (ICD10-I70.0).   Electronically Signed By: Rogelia Myers M.D. On: 06/01/2024 12:20   Assessment & Plan:    1. Kidney stones (Primary) -We discussed the management of kidney stones. These options include observation, ureteroscopy, shockwave lithotripsy (ESWL) and percutaneous nephrolithotomy (PCNL). We discussed which options are relevant to the patient's stone(s). We discussed the natural history of kidney stones as well as the complications of untreated stones and the impact on quality of life without treatment as well as with each of the above listed treatments. We also discussed the efficacy of each treatment in its ability to clear the stone burden. With any of these management options I discussed the signs and symptoms of infection and the need for emergent treatment should these be experienced. For each option we discussed the ability of each procedure to clear the patient of their stone burden.   For observation I described the risks which include but are not limited to silent renal damage, life-threatening infection, need for emergent surgery, failure to pass stone and pain.   For ureteroscopy I described the risks which include bleeding, infection, damage to contiguous structures, positioning injury, ureteral stricture, ureteral avulsion, ureteral injury, need for prolonged ureteral stent,  inability to perform ureteroscopy, need for an interval procedure, inability to clear stone burden, stent discomfort/pain, heart attack, stroke, pulmonary embolus and the inherent risks with general anesthesia.   For shockwave lithotripsy I described the risks which include arrhythmia, kidney contusion, kidney hemorrhage, need for transfusion, pain, inability to adequately break up stone, inability to pass stone fragments, Steinstrasse, infection associated with obstructing stones, need for alternate surgical procedure, need for repeat shockwave lithotripsy, MI, CVA, PE and the inherent risks with anesthesia/conscious sedation.   For PCNL I described the risks including positioning injury, pneumothorax, hydrothorax, need for chest tube, inability to clear stone burden, renal laceration, arterial venous fistula or malformation, need for embolization of kidney, loss of kidney or renal function, need for repeat procedure, need for prolonged nephrostomy tube, ureteral avulsion, MI, CVA, PE and the inherent risks of general anesthesia.   - The patient would like to proceed with right ESWL - Urinalysis, Routine w reflex microscopic   No follow-ups on file.  [name], MD  [facility] [place]

## 2024-07-19 ENCOUNTER — Other Ambulatory Visit: Payer: Self-pay

## 2024-07-19 ENCOUNTER — Encounter (HOSPITAL_COMMUNITY)
Admission: RE | Admit: 2024-07-19 | Discharge: 2024-07-19 | Disposition: A | Discharge status: Home / Self Care | Source: Ambulatory Visit | Attending: Urology | Admitting: Urology

## 2024-07-19 ENCOUNTER — Encounter (HOSPITAL_COMMUNITY): Payer: Self-pay

## 2024-07-19 HISTORY — DX: Chronic obstructive pulmonary disease, unspecified: J44.9

## 2024-07-19 HISTORY — DX: Personal history of urinary calculi: Z87.442

## 2024-07-19 HISTORY — DX: Sleep apnea, unspecified: G47.30

## 2024-07-24 ENCOUNTER — Encounter (HOSPITAL_COMMUNITY): Payer: Self-pay | Admitting: Urology

## 2024-07-24 ENCOUNTER — Ambulatory Visit (HOSPITAL_COMMUNITY)

## 2024-07-24 ENCOUNTER — Ambulatory Visit (HOSPITAL_COMMUNITY)
Admission: RE | Admit: 2024-07-24 | Discharge: 2024-07-24 | Disposition: A | Discharge status: Home / Self Care | Attending: Urology | Admitting: Urology

## 2024-07-24 ENCOUNTER — Encounter (HOSPITAL_COMMUNITY)
Admission: RE | Disposition: A | Discharge status: Home / Self Care | Payer: Self-pay | Source: Home / Self Care | Attending: Urology

## 2024-07-24 DIAGNOSIS — E785 Hyperlipidemia, unspecified: Secondary | ICD-10-CM | POA: Insufficient documentation

## 2024-07-24 DIAGNOSIS — G4730 Sleep apnea, unspecified: Secondary | ICD-10-CM | POA: Insufficient documentation

## 2024-07-24 DIAGNOSIS — N132 Hydronephrosis with renal and ureteral calculous obstruction: Secondary | ICD-10-CM | POA: Insufficient documentation

## 2024-07-24 DIAGNOSIS — K5730 Diverticulosis of large intestine without perforation or abscess without bleeding: Secondary | ICD-10-CM | POA: Insufficient documentation

## 2024-07-24 DIAGNOSIS — E119 Type 2 diabetes mellitus without complications: Secondary | ICD-10-CM | POA: Insufficient documentation

## 2024-07-24 DIAGNOSIS — N201 Calculus of ureter: Secondary | ICD-10-CM

## 2024-07-24 DIAGNOSIS — N400 Enlarged prostate without lower urinary tract symptoms: Secondary | ICD-10-CM | POA: Insufficient documentation

## 2024-07-24 DIAGNOSIS — Z79899 Other long term (current) drug therapy: Secondary | ICD-10-CM | POA: Insufficient documentation

## 2024-07-24 DIAGNOSIS — Z87442 Personal history of urinary calculi: Secondary | ICD-10-CM | POA: Insufficient documentation

## 2024-07-24 DIAGNOSIS — K760 Fatty (change of) liver, not elsewhere classified: Secondary | ICD-10-CM | POA: Insufficient documentation

## 2024-07-24 DIAGNOSIS — Z7984 Long term (current) use of oral hypoglycemic drugs: Secondary | ICD-10-CM | POA: Insufficient documentation

## 2024-07-24 DIAGNOSIS — I700 Atherosclerosis of aorta: Secondary | ICD-10-CM | POA: Insufficient documentation

## 2024-07-24 DIAGNOSIS — I10 Essential (primary) hypertension: Secondary | ICD-10-CM | POA: Insufficient documentation

## 2024-07-24 HISTORY — PX: EXTRACORPOREAL SHOCK WAVE LITHOTRIPSY: SHX1557

## 2024-07-24 LAB — GLUCOSE, CAPILLARY: Glucose-Capillary: 179 mg/dL — ABNORMAL HIGH (ref 70–99)

## 2024-07-24 SURGERY — LITHOTRIPSY, ESWL
Anesthesia: LOCAL | Laterality: Right

## 2024-07-24 MED ORDER — OXYCODONE-ACETAMINOPHEN 5-325 MG PO TABS: 1.0000 | ORAL_TABLET | Freq: Four times a day (QID) | ORAL | 0 refills | Status: AC | PRN

## 2024-07-24 MED ORDER — DIPHENHYDRAMINE HCL 25 MG PO CAPS
25.0000 mg | ORAL_CAPSULE | ORAL | Status: AC
  Administered 2024-07-24: 25 mg via ORAL
  Filled 2024-07-24: qty 1

## 2024-07-24 MED ORDER — SODIUM CHLORIDE 0.9 % IV SOLN: INTRAVENOUS | Status: DC

## 2024-07-24 MED ORDER — ONDANSETRON 4 MG PO TBDP: 4.0000 mg | ORAL_TABLET | Freq: Three times a day (TID) | ORAL | 0 refills | Status: AC | PRN

## 2024-07-24 MED ORDER — DIAZEPAM 5 MG PO TABS
10.0000 mg | ORAL_TABLET | Freq: Once | ORAL | Status: AC
  Administered 2024-07-24: 10 mg via ORAL
  Filled 2024-07-24: qty 2

## 2024-07-24 MED ORDER — TAMSULOSIN HCL 0.4 MG PO CAPS: 0.4000 mg | ORAL_CAPSULE | Freq: Every day | ORAL | 0 refills | Status: AC

## 2024-07-24 NOTE — Progress Notes (Signed)
 Assessed pt anterior right side where treatment was performed. Light redness noted, no complaints.

## 2024-07-24 NOTE — Interval H&P Note (Signed)
 History and Physical Interval Note:  07/24/2024 8:01 AM  Andrew Roach  has presented today for surgery, with the diagnosis of right ureteral stone.  The various methods of treatment have been discussed with the patient and family. After consideration of risks, benefits and other options for treatment, the patient has consented to  Procedure(s): LITHOTRIPSY, ESWL (Right) as a surgical intervention.  The patient's history has been reviewed, patient examined, no change in status, stable for surgery.  I have reviewed the patient's chart and labs.  Questions were answered to the patient's satisfaction.     [name]

## 2024-07-25 ENCOUNTER — Encounter (HOSPITAL_COMMUNITY): Payer: Self-pay | Admitting: Urology

## 2024-08-01 ENCOUNTER — Ambulatory Visit (HOSPITAL_COMMUNITY)
Admission: RE | Admit: 2024-08-01 | Discharge: 2024-08-01 | Disposition: A | Source: Ambulatory Visit | Attending: Urology

## 2024-08-01 DIAGNOSIS — N200 Calculus of kidney: Secondary | ICD-10-CM | POA: Diagnosis present

## 2024-08-03 ENCOUNTER — Emergency Department (HOSPITAL_COMMUNITY)

## 2024-08-03 ENCOUNTER — Encounter (HOSPITAL_COMMUNITY): Payer: Self-pay | Admitting: *Deleted

## 2024-08-03 ENCOUNTER — Emergency Department (HOSPITAL_COMMUNITY)
Admission: EM | Admit: 2024-08-03 | Discharge: 2024-08-03 | Disposition: A | Discharge status: Home / Self Care | Attending: Emergency Medicine | Admitting: Emergency Medicine

## 2024-08-03 ENCOUNTER — Other Ambulatory Visit: Payer: Self-pay

## 2024-08-03 DIAGNOSIS — E119 Type 2 diabetes mellitus without complications: Secondary | ICD-10-CM | POA: Insufficient documentation

## 2024-08-03 DIAGNOSIS — I10 Essential (primary) hypertension: Secondary | ICD-10-CM | POA: Insufficient documentation

## 2024-08-03 DIAGNOSIS — R0789 Other chest pain: Secondary | ICD-10-CM | POA: Insufficient documentation

## 2024-08-03 DIAGNOSIS — Z7984 Long term (current) use of oral hypoglycemic drugs: Secondary | ICD-10-CM | POA: Insufficient documentation

## 2024-08-03 LAB — BASIC METABOLIC PANEL WITH GFR
Anion gap: 13 (ref 5–15)
BUN: 16 mg/dL (ref 6–20)
CO2: 22 mmol/L (ref 22–32)
Calcium: 9 mg/dL (ref 8.9–10.3)
Chloride: 103 mmol/L (ref 98–111)
Creatinine, Ser: 1.28 mg/dL — ABNORMAL HIGH (ref 0.61–1.24)
GFR, Estimated: >60 mL/min (ref >60)
Glucose, Bld: 219 mg/dL — ABNORMAL HIGH (ref 70–99)
Potassium: 4.3 mmol/L (ref 3.5–5.1)
Sodium: 138 mmol/L (ref 135–145)

## 2024-08-03 LAB — CBC
HCT: 41.1 % (ref 39.0–52.0)
Hemoglobin: 14.4 g/dL (ref 13.0–17.0)
MCH: 32.4 pg (ref 26.0–34.0)
MCHC: 35 g/dL (ref 30.0–36.0)
MCV: 92.6 fL (ref 80.0–100.0)
Platelets: 226 K/uL (ref 150–400)
RBC: 4.44 MIL/uL (ref 4.22–5.81)
RDW: 12.3 % (ref 11.5–15.5)
WBC: 6.7 K/uL (ref 4.0–10.5)
nRBC: 0 % (ref 0.0–0.2)

## 2024-08-03 LAB — D-DIMER, QUANTITATIVE: D-Dimer, Quant: 0.48 ug{FEU}/mL (ref 0.00–0.50)

## 2024-08-03 LAB — HEPATIC FUNCTION PANEL
ALT: 76 U/L — ABNORMAL HIGH (ref 0–44)
AST: 55 U/L — ABNORMAL HIGH (ref 15–41)
Albumin: 3.8 g/dL (ref 3.5–5.0)
Alkaline Phosphatase: 109 U/L (ref 38–126)
Bilirubin, Direct: 0.1 mg/dL (ref 0.0–0.2)
Indirect Bilirubin: 0.2 mg/dL — ABNORMAL LOW (ref 0.3–0.9)
Total Bilirubin: 0.3 mg/dL (ref 0.0–1.2)
Total Protein: 6.5 g/dL (ref 6.5–8.1)

## 2024-08-03 LAB — TROPONIN T, HIGH SENSITIVITY
Troponin T High Sensitivity: <15 ng/L (ref 0–19)
Troponin T High Sensitivity: <15 ng/L (ref 0–19)

## 2024-08-03 MED ORDER — OXYCODONE-ACETAMINOPHEN 5-325 MG PO TABS
1.0000 | ORAL_TABLET | Freq: Once | ORAL | Status: AC
  Administered 2024-08-03: 1 via ORAL
  Filled 2024-08-03: qty 1

## 2024-08-03 MED ORDER — HYDROCODONE-ACETAMINOPHEN 5-325 MG PO TABS: ORAL_TABLET | ORAL | 0 refills | Status: AC

## 2024-08-03 NOTE — Discharge Instructions (Signed)
 Rest over the weekend and follow-up with your family doctor next week.  Return if getting worse

## 2024-08-03 NOTE — ED Triage Notes (Addendum)
 Pt c/o pain under bilateral arms, mid chest and mid back ongoing x 2 months.  Recent kidney stones with lithotripsy per pt.

## 2024-08-05 NOTE — ED Provider Notes (Signed)
 [facility]PARTMENT AT [facility] Provider Note   CSN: [id number] Arrival date & time: 08/03/24  1637     Patient presents with: Chest Pain   Andrew Roach is a 58 y.o. male.   Patient complains of a left-sided chest discomfort.  Pain is worse with movement.  No shortness of breath or sweating.  Patient has history of hypertension and diabetes  The history is provided by the patient and medical records. No language interpreter was used.  Chest Pain Pain location:  L chest Pain quality: aching   Pain radiates to:  Does not radiate Pain severity:  Mild Onset quality:  Sudden Timing:  Intermittent Progression:  Worsening Chronicity:  New Context: not breathing   Relieved by:  Nothing Worsened by:  Nothing Associated symptoms: no abdominal pain, no back pain, no cough, no fatigue and no headache        Prior to Admission medications   Medication Sig Start Date End Date Taking? Authorizing Provider  HYDROcodone -acetaminophen  (NORCO/VICODIN) 5-325 MG tablet Take 1 every 6 hours for pain not relieved by Tylenol  alone 08/03/24  Yes Deretha Ertle, MD  Cholecalciferol (VITAMIN D3) 50 MCG (2000 UT) TABS Take 1 tablet by mouth daily.    [provider]  dicyclomine  (BENTYL ) 20 MG tablet Take 1 tablet (20 mg total) by mouth 2 (two) times daily. 04/27/24   Daralene Lonni BIRCH, PA-C  Dulaglutide (TRULICITY) 0.75 MG/0.5ML SOPN Inject 0.75 mg into the skin once a week. Saturday or Sunday    [provider]  lisinopril (ZESTRIL) 20 MG tablet Take 20 mg by mouth daily.    [provider]  metFORMIN (GLUCOPHAGE) 1000 MG tablet Take 1,000 mg by mouth daily.    [provider]  ondansetron  (ZOFRAN -ODT) 4 MG disintegrating tablet Take 1 tablet (4 mg total) by mouth every 8 (eight) hours as needed for nausea or vomiting. 07/24/24   McKenzie, Belvie CROME, MD  oxyCODONE -acetaminophen  (PERCOCET/ROXICET) 5-325 MG tablet Take 1 tablet by mouth  every 6 (six) hours as needed for severe pain (pain score 7-10). 07/24/24   McKenzie, Belvie CROME, MD  rosuvastatin (CRESTOR) 10 MG tablet Take 10 mg by mouth daily.    [provider]  sitaGLIPtin (JANUVIA) 100 MG tablet Take 100 mg by mouth daily.    [provider]  tamsulosin  (FLOMAX ) 0.4 MG CAPS capsule Take 1 capsule (0.4 mg total) by mouth daily. 07/24/24   McKenzie, Belvie CROME, MD    Allergies: Shellfish allergy    Review of Systems  Constitutional:  Negative for appetite change and fatigue.  HENT:  Negative for congestion, ear discharge and sinus pressure.   Eyes:  Negative for discharge.  Respiratory:  Negative for cough.   Cardiovascular:  Positive for chest pain.  Gastrointestinal:  Negative for abdominal pain and diarrhea.  Genitourinary:  Negative for frequency and hematuria.  Musculoskeletal:  Negative for back pain.  Skin:  Negative for rash.  Neurological:  Negative for seizures and headaches.  Psychiatric/Behavioral:  Negative for hallucinations.     Updated Vital Signs BP 123/84 (BP Location: Left Arm)   Pulse 60   Temp 98.3 F (36.8 C) (Oral)   Resp 18   Ht 5' 10 (1.778 m)   Wt 122.5 kg   SpO2 97%   BMI 38.74 kg/m   Physical Exam Vitals and nursing note reviewed.  Constitutional:      Appearance: He is well-developed.  HENT:     Head: Normocephalic.  Nose: Nose normal.  Eyes:     General: No scleral icterus.    Conjunctiva/sclera: Conjunctivae normal.  Neck:     Thyroid: No thyromegaly.  Cardiovascular:     Rate and Rhythm: Normal rate and regular rhythm.     Heart sounds: No murmur heard.    No friction rub. No gallop.  Pulmonary:     Breath sounds: No stridor. No wheezing or rales.  Chest:     Chest wall: Tenderness present.  Abdominal:     General: There is no distension.     Tenderness: There is no abdominal tenderness. There is no rebound.  Musculoskeletal:        General: Normal range of motion.     Cervical back:  Neck supple.  Lymphadenopathy:     Cervical: No cervical adenopathy.  Skin:    Findings: No erythema or rash.  Neurological:     Mental Status: He is alert and oriented to person, place, and time.     Motor: No abnormal muscle tone.     Coordination: Coordination normal.  Psychiatric:        Behavior: Behavior normal.     (all labs ordered are listed, but only abnormal results are displayed) Labs Reviewed  BASIC METABOLIC PANEL WITH GFR - Abnormal; Notable for the following components:      Result Value   Glucose, Bld 219 (*)    Creatinine, Ser 1.28 (*)    All other components within normal limits  HEPATIC FUNCTION PANEL - Abnormal; Notable for the following components:   AST 55 (*)    ALT 76 (*)    Indirect Bilirubin 0.2 (*)    All other components within normal limits  CBC  D-DIMER, QUANTITATIVE  TROPONIN T, HIGH SENSITIVITY  TROPONIN T, HIGH SENSITIVITY    EKG: EKG Interpretation Date/Time:  Friday August 03 2024 17:13:43 EST Ventricular Rate:  112 PR Interval:  172 QRS Duration:  98 QT Interval:  402 QTC Calculation: 548 R Axis:   -47  Text Interpretation: Sinus rhythm with frequent Premature ventricular complexes Incomplete right bundle branch block Left anterior fascicular block Prolonged QT Abnormal ECG When compared with ECG of 09-May-2024 15:27, Premature ventricular complexes are now Present Vent. rate has increased BY  43 BPM T wave inversion no longer evident in Inferior leads Nonspecific T wave abnormality no longer evident in Anterior leads Confirmed by Suzette Pac [phone]) on 08/03/2024 7:14:56 PM  Radiology: DG Chest 2 View Result Date: 08/03/2024 EXAM: 2 VIEW(S) XRAY OF THE CHEST 08/03/2024 05:33:40 PM COMPARISON: 05/09/2024 CLINICAL HISTORY: pain to mid chest and bilateral ribs FINDINGS: LUNGS AND PLEURA: No focal pulmonary opacity. No pleural effusion. No pneumothorax. HEART AND MEDIASTINUM: No acute abnormality of the cardiac and mediastinal  silhouettes. BONES AND SOFT TISSUES: No acute osseous abnormality. IMPRESSION: 1. No acute cardiopulmonary abnormality. Electronically signed by: Lynwood Seip MD 08/03/2024 05:36 PM EST RP Workstation: [id number]     Procedures   Medications Ordered in the ED  oxyCODONE -acetaminophen  (PERCOCET/ROXICET) 5-325 MG per tablet 1 tablet (1 tablet Oral Given 08/03/24 2021)                                    Medical Decision Making Amount and/or Complexity of Data Reviewed Labs: ordered. Radiology: ordered.  Risk Prescription drug management.   Patient with chest discomfort and which is most likely chest wall pain.  Troponin is negative.  D-dimer negative.  Patient given pain medicine will follow-up with his family doctor     Final diagnoses:  Atypical chest pain    ED Discharge Orders          Ordered    HYDROcodone -acetaminophen  (NORCO/VICODIN) 5-325 MG tablet        08/03/24 2135               Suzette Pac, MD 08/05/24 [phone]

## 2024-08-06 ENCOUNTER — Telehealth: Payer: Self-pay | Admitting: Urology

## 2024-08-06 NOTE — Telephone Encounter (Signed)
 Patient returned call requesting KUB results.   Test was completed on 08/03/2024.   Best number to contact patient [phone] Call transferred to clinical basket.

## 2024-08-06 NOTE — Telephone Encounter (Signed)
Please review KUB

## 2024-08-07 NOTE — Telephone Encounter (Signed)
 How would you like the patient to follow up?

## 2024-08-08 ENCOUNTER — Telehealth: Payer: Self-pay

## 2024-08-08 NOTE — Telephone Encounter (Signed)
 Pt called in today to inquired about his KUB after litho. Pt state's he back is hurting several and want to know if his stone are gone. Pt is aware a message will be sent Dr. Sherrilee for review. Voiced understanding

## 2024-08-10 NOTE — Telephone Encounter (Signed)
 Verbal from MD to follow up in 3 months with a renal US .  Scheduled and US  ordered.

## 2024-08-14 NOTE — Telephone Encounter (Signed)
 Called pt, state's he is still hurting and he went to [facility] imaging and had a CT done that confirmed stone is still presented. Pt is made aware to bring CT in for Dr. Sherrilee to review. Pt state's he will get CT scan sent over to us  for MD to review. Pt voiced understanding KUb negatives. See me in 6 monhts with a KUB

## 2024-08-15 NOTE — Telephone Encounter (Addendum)
 Return call to patient. Patient state's he brought in his disk from his CT. Pt is aware we will have MD review and call back with MD recommendation. Pt state's he is in a lot of pain. Voiced understanding

## 2024-08-15 NOTE — Telephone Encounter (Signed)
 Return call to pt made him aware that his pain is not coming from a kidney stone due to non obstruction , verbal from Dr. Sherrilee. Pt is advised to contact PCP for further testing due to his pain he is experiencing. Voiced understanding

## 2024-09-05 ENCOUNTER — Telehealth: Payer: Self-pay

## 2024-09-05 NOTE — Telephone Encounter (Signed)
 Patient is concern about his back pain and want answer. Pt is aware a message

## 2024-09-05 NOTE — Telephone Encounter (Addendum)
 Pt made aware and voiced understanding. Pt state's he went to [facility] imaging and he still have a stone. Pt state's his PCP state's he still have a stone and he is still hurting.  Pt's state's his CT shows a stone and he would like Dr. Sherrilee to call him to explain because he is about to get mad.  Pt state's he is still hurting

## 2024-09-05 NOTE — Telephone Encounter (Signed)
-----   Message from [name], MD sent at 09/04/2024  8:20 AM EST ----- KUB negative ----- Message ----- From: Gretta Carlos SAUNDERS, CMA Sent: 08/08/2024   9:14 AM EST To: Belvie LITTIE Clara, MD  Please review and advise. Pt want results and his back is hurting

## 2024-12-12 ENCOUNTER — Ambulatory Visit: Admitting: Urology
